# Patient Record
Sex: Female | Born: 1980 | Race: White | Hispanic: No | Marital: Single | Smoking: Never smoker
Health system: Southern US, Community
[De-identification: ages and names within clinical notes are randomized; demographics above are authoritative.]

## PROBLEM LIST (undated history)

## (undated) DIAGNOSIS — I10 Essential (primary) hypertension: Secondary | ICD-10-CM

## (undated) HISTORY — PX: LAPAROSCOPIC OVARIAN: SHX5906

## (undated) HISTORY — PX: VEIN LIGATION AND STRIPPING: SHX2653

## (undated) HISTORY — PX: HERNIA REPAIR: SHX51

## (undated) HISTORY — PX: MENISCUS REPAIR: SHX5179

## (undated) HISTORY — PX: PILONIDAL CYST EXCISION: SHX744

---

## 2001-02-20 ENCOUNTER — Emergency Department (HOSPITAL_COMMUNITY): Admission: EM | Admit: 2001-02-20 | Discharge: 2001-02-21 | Payer: Self-pay | Admitting: Emergency Medicine

## 2001-02-21 ENCOUNTER — Encounter: Payer: Self-pay | Admitting: Emergency Medicine

## 2001-02-26 ENCOUNTER — Emergency Department (HOSPITAL_COMMUNITY): Admission: EM | Admit: 2001-02-26 | Discharge: 2001-02-26 | Payer: Self-pay | Admitting: Emergency Medicine

## 2001-04-18 ENCOUNTER — Emergency Department (HOSPITAL_COMMUNITY): Admission: EM | Admit: 2001-04-18 | Discharge: 2001-04-19 | Payer: Self-pay | Admitting: Emergency Medicine

## 2001-04-18 ENCOUNTER — Encounter: Payer: Self-pay | Admitting: Emergency Medicine

## 2001-09-08 ENCOUNTER — Emergency Department (HOSPITAL_COMMUNITY): Admission: EM | Admit: 2001-09-08 | Discharge: 2001-09-08 | Payer: Self-pay | Admitting: *Deleted

## 2002-01-11 ENCOUNTER — Emergency Department (HOSPITAL_COMMUNITY): Admission: EM | Admit: 2002-01-11 | Discharge: 2002-01-11 | Payer: Self-pay | Admitting: *Deleted

## 2002-02-25 ENCOUNTER — Emergency Department (HOSPITAL_COMMUNITY): Admission: EM | Admit: 2002-02-25 | Discharge: 2002-02-25 | Payer: Self-pay | Admitting: Emergency Medicine

## 2002-08-26 ENCOUNTER — Ambulatory Visit (HOSPITAL_BASED_OUTPATIENT_CLINIC_OR_DEPARTMENT_OTHER): Admission: RE | Admit: 2002-08-26 | Discharge: 2002-08-26 | Payer: Self-pay | Admitting: General Surgery

## 2002-09-27 ENCOUNTER — Encounter: Payer: Self-pay | Admitting: Emergency Medicine

## 2002-09-27 ENCOUNTER — Emergency Department (HOSPITAL_COMMUNITY): Admission: EM | Admit: 2002-09-27 | Discharge: 2002-09-27 | Payer: Self-pay | Admitting: Emergency Medicine

## 2003-08-26 ENCOUNTER — Ambulatory Visit (HOSPITAL_COMMUNITY): Admission: RE | Admit: 2003-08-26 | Discharge: 2003-08-26 | Payer: Self-pay | Admitting: Emergency Medicine

## 2003-08-26 ENCOUNTER — Emergency Department (HOSPITAL_COMMUNITY): Admission: EM | Admit: 2003-08-26 | Discharge: 2003-08-26 | Payer: Self-pay | Admitting: Emergency Medicine

## 2004-06-11 ENCOUNTER — Emergency Department (HOSPITAL_COMMUNITY): Admission: EM | Admit: 2004-06-11 | Discharge: 2004-06-11 | Payer: Self-pay | Admitting: Emergency Medicine

## 2004-06-12 ENCOUNTER — Ambulatory Visit: Admission: RE | Admit: 2004-06-12 | Discharge: 2004-06-12 | Payer: Self-pay | Admitting: Emergency Medicine

## 2004-06-13 ENCOUNTER — Emergency Department (HOSPITAL_COMMUNITY): Admission: EM | Admit: 2004-06-13 | Discharge: 2004-06-13 | Payer: Self-pay | Admitting: Emergency Medicine

## 2006-10-23 ENCOUNTER — Ambulatory Visit: Payer: Self-pay | Admitting: Obstetrics and Gynecology

## 2007-11-17 ENCOUNTER — Emergency Department: Payer: Self-pay | Admitting: Internal Medicine

## 2008-08-17 ENCOUNTER — Ambulatory Visit: Payer: Self-pay | Admitting: Nurse Practitioner

## 2010-05-03 ENCOUNTER — Emergency Department: Payer: Self-pay | Admitting: Internal Medicine

## 2010-08-26 ENCOUNTER — Ambulatory Visit: Payer: Self-pay | Admitting: Family Medicine

## 2010-08-30 ENCOUNTER — Ambulatory Visit: Payer: Self-pay | Admitting: Unknown Physician Specialty

## 2012-06-14 DIAGNOSIS — I8390 Asymptomatic varicose veins of unspecified lower extremity: Secondary | ICD-10-CM | POA: Insufficient documentation

## 2012-06-14 DIAGNOSIS — R011 Cardiac murmur, unspecified: Secondary | ICD-10-CM | POA: Insufficient documentation

## 2012-06-23 ENCOUNTER — Ambulatory Visit: Payer: Self-pay | Admitting: Internal Medicine

## 2012-08-30 DIAGNOSIS — Q872 Congenital malformation syndromes predominantly involving limbs: Secondary | ICD-10-CM | POA: Insufficient documentation

## 2012-08-30 HISTORY — DX: Congenital malformation syndromes predominantly involving limbs: Q87.2

## 2012-11-08 DIAGNOSIS — Q279 Congenital malformation of peripheral vascular system, unspecified: Secondary | ICD-10-CM

## 2012-11-08 HISTORY — DX: Congenital malformation of peripheral vascular system, unspecified: Q27.9

## 2013-03-28 DIAGNOSIS — Q872 Congenital malformation syndromes predominantly involving limbs: Secondary | ICD-10-CM | POA: Insufficient documentation

## 2013-03-28 HISTORY — DX: Congenital malformation syndromes predominantly involving limbs: Q87.2

## 2013-04-27 DIAGNOSIS — I8310 Varicose veins of unspecified lower extremity with inflammation: Secondary | ICD-10-CM | POA: Insufficient documentation

## 2013-04-27 HISTORY — DX: Varicose veins of unspecified lower extremity with inflammation: I83.10

## 2014-11-20 DIAGNOSIS — F419 Anxiety disorder, unspecified: Secondary | ICD-10-CM | POA: Insufficient documentation

## 2015-03-09 ENCOUNTER — Ambulatory Visit: Payer: Self-pay | Admitting: Family Medicine

## 2015-04-17 ENCOUNTER — Other Ambulatory Visit: Payer: Self-pay | Admitting: Specialist

## 2015-04-17 DIAGNOSIS — M25561 Pain in right knee: Secondary | ICD-10-CM

## 2015-04-26 ENCOUNTER — Ambulatory Visit
Admission: RE | Admit: 2015-04-26 | Discharge: 2015-04-26 | Disposition: A | Discharge status: Home / Self Care | Payer: BC Managed Care – PPO | Source: Ambulatory Visit | Attending: Specialist | Admitting: Specialist

## 2015-04-26 DIAGNOSIS — M1711 Unilateral primary osteoarthritis, right knee: Secondary | ICD-10-CM | POA: Diagnosis not present

## 2015-04-26 DIAGNOSIS — M25561 Pain in right knee: Secondary | ICD-10-CM | POA: Diagnosis present

## 2015-04-26 DIAGNOSIS — M7121 Synovial cyst of popliteal space [Baker], right knee: Secondary | ICD-10-CM | POA: Insufficient documentation

## 2015-06-07 DIAGNOSIS — I10 Essential (primary) hypertension: Secondary | ICD-10-CM

## 2015-06-07 DIAGNOSIS — N809 Endometriosis, unspecified: Secondary | ICD-10-CM | POA: Insufficient documentation

## 2015-06-07 DIAGNOSIS — F329 Major depressive disorder, single episode, unspecified: Secondary | ICD-10-CM | POA: Insufficient documentation

## 2015-06-07 DIAGNOSIS — F32A Depression, unspecified: Secondary | ICD-10-CM | POA: Insufficient documentation

## 2015-06-07 DIAGNOSIS — R5383 Other fatigue: Secondary | ICD-10-CM | POA: Insufficient documentation

## 2015-06-07 HISTORY — DX: Essential (primary) hypertension: I10

## 2015-06-08 ENCOUNTER — Ambulatory Visit (INDEPENDENT_AMBULATORY_CARE_PROVIDER_SITE_OTHER): Payer: BC Managed Care – PPO | Admitting: Family Medicine

## 2015-06-08 ENCOUNTER — Encounter: Payer: Self-pay | Admitting: Family Medicine

## 2015-06-08 VITALS — BP 124/62 | HR 72 | Temp 98.3°F | Resp 16 | Ht 65.0 in | Wt 170.0 lb

## 2015-06-08 DIAGNOSIS — Z713 Dietary counseling and surveillance: Secondary | ICD-10-CM

## 2015-06-08 DIAGNOSIS — I10 Essential (primary) hypertension: Secondary | ICD-10-CM | POA: Diagnosis not present

## 2015-06-08 MED ORDER — METOPROLOL SUCCINATE ER 50 MG PO TB24: 50.0000 mg | ORAL_TABLET | Freq: Every day | ORAL | Status: DC

## 2015-06-08 NOTE — Progress Notes (Signed)
Patient ID: Norma Harris, female   DOB: March 27, 1981, 34 y.o.   MRN: [id number]   Patient: Norma Harris Female    DOB: 1981/04/03   34 y.o.   MRN: [id number] Visit Date: 06/08/2015  Today's Provider: Vernie Murders, PA   Chief Complaint  Patient presents with  . Hypertension   Subjective:    HPI   Hypertension, follow-up: Patient use to see Norma Harris and she was told to come in to follow up. BP Readings from Last 3 Encounters:  06/08/15 124/62    She was last seen for hypertension 6 months ago.   Management changes since that visit include none. She reports excellent compliance with treatment. She is not having side effects.  She is exercising. She is not adherent to low salt diet.   Outside blood pressures are 119/60. She is experiencing none.  Patient denies none.   Cardiovascular risk factors include none.  Use of agents associated with hypertension: none.     Weight trend: stable Wt Readings from Last 3 Encounters:  06/08/15 170 lb (77.111 kg)  04/26/15 160 lb (72.576 kg)    Current diet: in general, a "healthy" diet     Patient wanted to discuss new weight loss medications she will be taking and make sure it will not effect her B/P medication. Going on an "all natural" weight loss program ([employer]) - includes shakes and meals with an exercise program.      Previous Medications   CETIRIZINE HCL 10 MG CAPS    Take 1 capsule by mouth daily.   IBUPROFEN (ADVIL,MOTRIN) 400 MG TABLET    Take 400 mg by mouth.    Review of Systems  Constitutional: Negative.   Respiratory: Negative.   Cardiovascular: Negative.   Gastrointestinal: Negative.   Musculoskeletal: Negative.     History  Substance Use Topics  . Smoking status: Never Smoker   . Smokeless tobacco: Never Used  . Alcohol Use: Yes     Comment: very rare   Objective:   BP 124/62 mmHg  Pulse 72  Temp(Src) 98.3 F (36.8 C)  Resp 16  Ht 5\' 5"  (1.651 m)  Wt 170 lb (77.111 kg)  BMI 28.29 kg/m2   LMP   Physical Exam  Constitutional: She is oriented to person, place, and time. She appears well-developed and well-nourished. No distress.  HENT:  Head: Normocephalic and atraumatic.  Right Ear: Hearing normal.  Left Ear: Hearing normal.  Nose: Nose normal.  Eyes: Conjunctivae, EOM and lids are normal. Right eye exhibits no discharge. Left eye exhibits no discharge. No scleral icterus.  Neck: Normal range of motion. Neck supple.  Cardiovascular: Normal rate, regular rhythm and intact distal pulses.   Pulmonary/Chest: Effort normal. No respiratory distress.  Abdominal: Soft. Bowel sounds are normal.  Musculoskeletal: Normal range of motion.  Neurological: She is alert and oriented to person, place, and time.  Skin: Skin is warm, dry and intact. No lesion and no rash noted.  Psychiatric: She has a normal mood and affect. Her speech is normal and behavior is normal. Thought content normal.        Assessment & Plan:     1. Essential hypertension Stable and well controlled. No murmur auscultated today. Cardiologist started the Metoprolol to lower BP and help limit murmur. Will refill medication and recheck 3 months. - metoprolol succinate (TOPROL-XL) 50 MG 24 hr tablet; Take 1 tablet (50 mg total) by mouth daily. Takes 1/2-1 tablet daily  Dispense: 30 tablet; Refill:  6  2. Encounter for weight loss counseling Counseled regarding no use of stimulants and limiting caffeine. Should limit Chromium in supplements to 100-200 mcg qd (a natural glucose tolerance factor that can cause hypoglycemic episodes). Continue regular exercise program. Recheck to evaluate progress in 3 months.   Follow up: Return in about 3 months (around 09/08/2015).

## 2015-07-03 ENCOUNTER — Ambulatory Visit (INDEPENDENT_AMBULATORY_CARE_PROVIDER_SITE_OTHER): Payer: BC Managed Care – PPO | Admitting: Family Medicine

## 2015-07-03 ENCOUNTER — Encounter: Payer: Self-pay | Admitting: Family Medicine

## 2015-07-03 VITALS — BP 124/80 | HR 62 | Temp 98.0°F | Resp 16 | Wt 166.0 lb

## 2015-07-03 DIAGNOSIS — R143 Flatulence: Secondary | ICD-10-CM | POA: Diagnosis not present

## 2015-07-03 DIAGNOSIS — R141 Gas pain: Secondary | ICD-10-CM | POA: Diagnosis not present

## 2015-07-03 DIAGNOSIS — R1013 Epigastric pain: Secondary | ICD-10-CM | POA: Diagnosis not present

## 2015-07-03 DIAGNOSIS — R142 Eructation: Secondary | ICD-10-CM

## 2015-07-03 NOTE — Patient Instructions (Signed)
Celiac Disease Celiac disease is a digestive disease that causes your body's natural defense system (immune system) to react against its own cells. It interferes with taking in (absorbing) nutrients from food. Celiac disease is also known as celiac sprue, nontropical sprue, and gluten-sensitive enteropathy. People who have celiac disease cannot tolerate gluten. Gluten is a protein found in wheat, rye, and barley. With time, celiac disease will damage the cells lining the small intestine. This leads to being unable to absorb nutrients from food (malabsorption), diarrhea, and nutritional problems. CAUSES  Celiac disease is genetic. This means you have a higher likelihood of getting the disease if someone in your family has or has had it. Up to 10% of your close relatives (parent, sibling, child) may also have the disease.  People with celiac disease tend to have other autoimmune diseases. The link may be genetic. These diseases include:  Dermatitis herpetiformis.  Thyroid disease.  Systemic lupus erythematosus.  Type 1 diabetes.  Liver disease.  Collagen vascular disease.  Rheumatoid arthritis.  Sjogren syndrome. SYMPTOMS  The symptoms of celiac disease vary from person to person. The symptoms are generally digestive or nutritional. Digestive symptoms include:  Recurring belly (abdominal) bloating and pain.  Gas.  Long-term (chronic) diarrhea.  Pale, bad-smelling, greasy, or oily stool. Nutritional symptoms include:  Failure to thrive in infants.  Delayed growth in children.  Weight loss in children and adults.  Missed menstrual periods (often due to extreme weight loss).  Anemia.  Weakening bones (osteoporosis).  Fatigue and weakness.  Tingling or other signs of nerve damage (peripheral neuropathy).  Depression. DIAGNOSIS  If your symptoms and physical exam suggest that a digestive disorder or malnutrition is present, your caregiver may suspect celiac disease. You  may have already begun a gluten-free diet. If symptoms persist, testing may be needed to confirm the diagnosis. Some tests are best done while you are on a normal, unrestricted diet. Tests may include:  Blood tests to check for nutritional deficiencies.  Blood tests to look for evidence that the body is producing antibodies against its own small intestine cells.  Taking a tissue sample (biopsy) from the small bowel for evaluation.  X-rays of the small bowel.  Evaluating the stool for fat.  Tests to check for nutrient absorption from the intestine. TREATMENT  It is important to seek treatment. Untreated celiac disease can cause growth problems (in children), anemia, osteoporosis, and possible nerve problems. A pregnant patient with untreated celiac disease has a higher risk of miscarriage, and the fetus has an increased risk of low birth weight and other growth problems. If celiac disease is diagnosed in the early stages, treatment can allow you to live a long, nearly symptom-free life. Treatment includes following a gluten-free diet. This means avoiding all foods that contain gluten. Eating even a small amount of gluten can damage your intestine. For most people, following this diet will stop symptoms. It will heal existing intestinal damage and prevent further damage. Improvements begin within days of starting the diet. The small intestine is usually completely healed within 3 to 6 months, or it may take up to 2 years for older adults. A small percentage of people do not improve on the gluten-free diet. Depending on your age and the stage at which you were diagnosed, some problems such as delayed growth and discolored teeth may not improve. Sometimes, damaged intestines cannot heal. If your intestines are not absorbing enough nutrients, you may need to receive nutrition supplements through an intravenous (IV) tube.  Drug treatments are being tested for unresponsive celiac disease. In this case, you  may need to be evaluated for complications of the disease. Your caregiver may also recommend:  A pneumonia vaccination.  Nutrients and other treatments for any nutritional deficiencies. Your caregiver can provide you with more information on a gluten-free diet. Discussion with a dietitian skilled in this illness will be valuable. Support groups may also be helpful. HOME CARE INSTRUCTIONS   Focus on a gluten-free diet. This diet must become a way of life.  Monitor your response to the gluten-free diet and treat any nutritional deficiencies.  Prepare ahead of time if you decide to eat outside the home.  Make and keep your regular follow-up visits with your caregiver.  Suggest to family members that they get screened for early signs of the disease. SEEK MEDICAL CARE IF:   You continue to have digestive symptoms (gas, cramping, diarrhea) despite a proper diet.  You have trouble sticking to the gluten-free diet.  You develop an itchy rash with groups of tiny blisters.  You develop severe weakness, balance problems, menstrual problems, or depression. Document Released: 12/01/2005 Document Revised: 04/17/2014 Document Reviewed: 03/20/2010 [facility] Patient Information 2015 Norm[place], [place]. This information is not intended to replace advice given to you by your health care provider. Make sure you discuss any questions you have with your health care provider. Lactose Intolerance, Adult Lactose intolerance is when the body is not able to digest lactose, a sugar found in milk and milk products. Lactose intolerance is caused by your body not producing enough of the enzyme lactase. When there is not enough lactase to digest the amount of lactose consumed, discomfort may be felt. Lactose intolerance is not a milk allergy. For most people, lactase deficiency is a condition that develops naturally over time. After about the age of 2, the body begins to produce less lactase. But many people may not  experience symptoms until they are much older. CAUSES Things that can cause you to be lactose intolerant include:  Aging.  Being born without the ability to make lactase.  Certain digestive diseases.  Injuries to the small intestine. SYMPTOMS   Feeling sick to your stomach (nauseous).  Diarrhea.  Cramps.  Bloating.  Gas. Symptoms usually show up a half hour or 2 hours after eating or drinking products containing lactose. TREATMENT  No treatment can improve the body's ability to produce lactase. However, symptoms can be controlled through diet. A medicine may be given to you to take when you consume lactose-containing foods or drinks. The medicine contains the lactase enzyme, which help the body digest lactose better. HOME CARE INSTRUCTIONS  Eat or drink dairy products as told by your caregiver or dietician.  Take all medicine as directed by your caregiver.  Find lactose-free or lactose-reduced products at your local grocery store.  Talk to your caregiver or dietician to decide if you need any dietary supplements. The following is the amount of calcium needed from the diet:  19 to 50 years: 1000 mg  Over 50 years: 1200 mg Calcium and Lactose in Common Foods Non-Dairy Products / Calcium Content (mg)  Calcium-fortified orange juice, 1 cup / 308 to 344 mg  Sardines, with edible bones, 3 oz / 270 mg  Salmon, canned, with edible bones, 3 oz / 205 mg  Soymilk, fortified, 1 cup / 200 mg  Broccoli (raw), 1 cup / 90 mg  Orange, 1 medium / 50 mg  Pinto beans,  cup / 40 mg  Tuna, canned, 3 oz / 10 mg  Lettuce greens,  cup / 10 mg Dairy Products / Calcium Content (mg) / Lactose Content (g)  Yogurt, plain, low-fat, 1 cup / 415 mg / 5 g  Milk, reduced fat, 1 cup / 295 mg / 11 g  Swiss cheese, 1 oz / 270 mg / 1 g  Ice cream,  cup / 85 mg / 6 g  Cottage cheese,  cup / 75 mg / 2 to 3 g SEEK MEDICAL CARE IF: You have no relief from your symptoms. Document  Released: 12/01/2005 Document Revised: 02/23/2012 Document Reviewed: 03/03/2014 [facility] Patient Information 2015 [place], [place]. This information is not intended to replace advice given to you by your health care provider. Make sure you discuss any questions you have with your health care provider.

## 2015-07-03 NOTE — Progress Notes (Signed)
Patient ID: Norma Harris, female   DOB: 11-09-1981, 34 y.o.   MRN: [id number]       Patient: Norma Harris Female    DOB: August 20, 1981   35 y.o.   MRN: [id number] Visit Date: 07/03/2015  Today's Provider: Vernie Murders, PA   Chief Complaint  Patient presents with  . Allergic Reaction    started a diet on June 27th, and  first stomach pain was in July 18th, when eating dairy and breads, " get very bad stomach pain and gas"    Subjective:    HPI  This 34 year old female started an [employer] weight loss program 06-11-15. Noticed intermittent epigastric pain and gas with eating cheese, yogurt, wheat and diary products. States sister has lactose intolerance and wonders if she may have that, an allergy syndrome and/or Celiac disease. Denies hematemesis, hematochezia, melena, constipation or diarrhea.  No past medical history on file. Patient Active Problem List   Diagnosis Date Noted  . Clinical depression 06/07/2015  . Endometriosis 06/07/2015  . Fatigue 06/07/2015  . BP (high blood pressure) 06/07/2015  . Anxiety 11/20/2014  . 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 Cardiac murmur 06/14/2012   Past Surgical History  Procedure Laterality Date  . Hernia repair    . Pilonidal cyst excision    . Laparoscopic ovarian    . Meniscus repair      and ACL repai-has screws/staples-RIGHT  . Vein ligation and stripping      x 7   History  Substance Use Topics  . Smoking status: Never Smoker   . Smokeless tobacco: Never Used  . Alcohol Use: Yes     Comment: very rare   Family History  Problem Relation Age of Onset  . Fibromyalgia Mother   . Hyperlipidemia Father   . Alzheimer's disease Maternal Grandmother   . Diabetes Maternal Grandfather   . Cancer Maternal Grandfather   . Lung cancer Paternal Grandfather      Allergies  Allergen Reactions  . Arnica Rash    Hives,  blisters  . Duloxetine Hcl Other (See Comments)    Suicidal feelings  . Pseudoephedrine Hcl Other (See Comments)   Previous Medications   CETIRIZINE HCL 10 MG CAPS    Take 1 capsule by mouth daily.   IBUPROFEN (ADVIL,MOTRIN) 400 MG TABLET    Take 400 mg by mouth.   METOPROLOL SUCCINATE (TOPROL-XL) 50 MG 24 HR TABLET    Take 1 tablet (50 mg total) by mouth daily. Takes 1/2-1 tablet daily   Review of Systems  Constitutional: Negative.   HENT: Negative.   Respiratory: Negative.   Cardiovascular: Negative.   Gastrointestinal: Positive for abdominal pain. Negative for diarrhea and constipation.       Gas pains with certain foods. No hematemesis, melena or hematochezia.  Genitourinary: Negative.   Musculoskeletal: Negative.   Skin: Negative.   Psychiatric/Behavioral: Negative.     Objective:   BP 124/80 mmHg  Pulse 62  Temp(Src) 98 F (36.7 C) (Oral)  Resp 16  Wt 166 lb (75.297 kg)  Physical Exam  Constitutional: She is oriented to person, place, and time. She appears well-developed and well-nourished. No distress.  HENT:  Head: Normocephalic and atraumatic.  Right Ear: Hearing normal.  Left Ear: Hearing normal.  Nose: Nose normal.  Eyes: Conjunctivae, EOM and lids are normal. Pupils are equal, round, and reactive to light. Right eye exhibits  no discharge. Left eye exhibits no discharge. No scleral icterus.  Cardiovascular: Normal rate and regular rhythm.   Pulmonary/Chest: Effort normal and breath sounds normal. No respiratory distress.  Abdominal: Soft. Bowel sounds are normal. She exhibits no distension and no mass.  Musculoskeletal: Normal range of motion.  Neurological: She is alert and oriented to person, place, and time.  Skin: Skin is intact. No lesion and no rash noted.  Psychiatric: She has a normal mood and affect. Her speech is normal and behavior is normal. Thought content normal.      Assessment & Plan:     1. Epigastric pain Recent onset with starting a  weight loss program. Recommend she use Gas-X and/or antacid or ranitidine. Will get tests to rule out Celiac and Lactose Intolerance disease. Limit fats, dairy products and restrict glutens. Recheck pending lab reports. - CELIAC DISEASE COMPLETE PANEL - Lactose tolerance test  2. Flatulence, eructation and gas pain  Worsening with new weight loss diet. Recommend recheck if tests for [name] or Lactose intolerance negative. May have to get ultrasound to rule out gallbladder disease. - CELIAC DISEASE COMPLETE PANEL - Lactose tolerance test         Vernie Murders, PA  [facility]

## 2015-07-04 ENCOUNTER — Telehealth: Payer: Self-pay | Admitting: Family Medicine

## 2015-07-04 NOTE — Telephone Encounter (Signed)
Pt called saying she had her allergy testing done today but they did not have a test lactose.  She just wanted to know where she can get that test done.  [phone].  Thanks, Con Memos

## 2015-07-05 LAB — CELIAC DISEASE COMPLETE PANEL
Antigliadin Abs, IgA: 7 units (ref 0–19)
GLIADIN IGG: 1 U (ref 0–19)
Tissue Transglut Ab: <2 U/mL (ref 0–5)
Transglutaminase IgA: <2 U/mL (ref 0–3)

## 2015-07-06 NOTE — Telephone Encounter (Signed)
See below

## 2015-07-06 NOTE — Telephone Encounter (Signed)
-----   Message from Margo Common, [place] sent at 07/06/2015 12:49 PM EDT ----- [name]/sprue test negative for glutein problems. The Lactose Intolerance test report has not come in yet.

## 2015-07-06 NOTE — Telephone Encounter (Signed)
[id number] 

## 2015-07-17 NOTE — Telephone Encounter (Signed)
LM for patient  TCB-07/17/15  Note: Spoke with Angie in Customer Service regarding the test order for Lactose Intolerance test. Per Angie test was order in the requisition form but was marked off.  They do perform the test, Patient needs to call to schedule an appointment at the [phone] and will need a new order form print out.  Thanks,  -Joseline

## 2015-07-17 NOTE — Telephone Encounter (Signed)
Pt is returning call.  CB#[phone]/MW

## 2015-07-17 NOTE — Telephone Encounter (Signed)
Patient called back in regards to Lactose test. Advised patient as below. Please call patient when you have new requisition ready, and write down [phone] number so patient can call and schedule her appt. The patient's contact information is correct.

## 2015-07-17 NOTE — Telephone Encounter (Signed)
Patient advised that [name]/Sprue test negative for glutein problems. The Lactose Intolerance test report has not come in yet. Per patient the reason she(Norma Harris) had call was because she(Norma Harris) didn't get the the lactose test, and wanted to know where can she get that test done.  Please Advise.  Thanks,  -Joseline

## 2015-08-28 ENCOUNTER — Encounter: Payer: Self-pay | Admitting: Emergency Medicine

## 2015-08-28 ENCOUNTER — Emergency Department: Payer: BC Managed Care – PPO

## 2015-08-28 ENCOUNTER — Emergency Department
Admission: EM | Admit: 2015-08-28 | Discharge: 2015-08-28 | Disposition: A | Discharge status: Home / Self Care | Payer: BC Managed Care – PPO | Attending: Emergency Medicine | Admitting: Emergency Medicine

## 2015-08-28 DIAGNOSIS — R0602 Shortness of breath: Secondary | ICD-10-CM | POA: Diagnosis present

## 2015-08-28 DIAGNOSIS — I10 Essential (primary) hypertension: Secondary | ICD-10-CM | POA: Insufficient documentation

## 2015-08-28 DIAGNOSIS — Z79899 Other long term (current) drug therapy: Secondary | ICD-10-CM | POA: Insufficient documentation

## 2015-08-28 DIAGNOSIS — F419 Anxiety disorder, unspecified: Secondary | ICD-10-CM | POA: Diagnosis not present

## 2015-08-28 DIAGNOSIS — R091 Pleurisy: Secondary | ICD-10-CM | POA: Insufficient documentation

## 2015-08-28 HISTORY — DX: Essential (primary) hypertension: I10

## 2015-08-28 LAB — BASIC METABOLIC PANEL
Anion gap: 9 (ref 5–15)
BUN: 11 mg/dL (ref 6–20)
CO2: 24 mmol/L (ref 22–32)
Calcium: 9.6 mg/dL (ref 8.9–10.3)
Chloride: 107 mmol/L (ref 101–111)
Creatinine, Ser: 0.83 mg/dL (ref 0.44–1.00)
GFR calc non Af Amer: >60 mL/min (ref >60)
Glucose, Bld: 109 mg/dL — ABNORMAL HIGH (ref 65–99)
POTASSIUM: 3.5 mmol/L (ref 3.5–5.1)
SODIUM: 140 mmol/L (ref 135–145)

## 2015-08-28 LAB — FIBRIN DERIVATIVES D-DIMER (ARMC ONLY): Fibrin derivatives D-dimer (ARMC): 248 (ref 0–499)

## 2015-08-28 LAB — CBC
HEMATOCRIT: 41.1 % (ref 35.0–47.0)
HEMOGLOBIN: 13.8 g/dL (ref 12.0–16.0)
MCH: 29.6 pg (ref 26.0–34.0)
MCHC: 33.5 g/dL (ref 32.0–36.0)
MCV: 88.5 fL (ref 80.0–100.0)
Platelets: 282 10*3/uL (ref 150–440)
RBC: 4.64 MIL/uL (ref 3.80–5.20)
RDW: 13.4 % (ref 11.5–14.5)
WBC: 12.1 10*3/uL — AB (ref 3.6–11.0)

## 2015-08-28 LAB — TROPONIN I: Troponin I: <0.03 ng/mL (ref <0.031)

## 2015-08-28 NOTE — Discharge Instructions (Signed)

## 2015-08-28 NOTE — ED Notes (Signed)
Patient ambulatory to triage with steady gait, without difficulty or distress noted; pt reports since yesterday having pain to left side of neck radiating down into throat and chest with SOB; denies any recent illness; reports some elevated BP

## 2015-08-28 NOTE — ED Provider Notes (Signed)
[facility] Emergency Department Provider Note  ____________________________________________  Time seen: On arrival  I have reviewed the triage vital signs and the nursing notes.   HISTORY  Chief Complaint Shortness of Breath and Chest Pain    HPI Norma Harris is a 34 y.o. female who presents with complaints of pain in the bilateral neck which then progressed to her chest. This started yesterday but has mostly improved today. She was concerned because her blood pressure was elevated when she took it at home. She complains of mild pleurisy but denies overt chest pain. No recent travel. No leg swelling or pain. 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 No fevers no chills. No cough     Past Medical History  Diagnosis Date  . Hypertension     Patient Active Problem List   Diagnosis Date Noted  . Clinical depression 06/07/2015  . Endometriosis 06/07/2015  . Fatigue 06/07/2015  . BP (high blood pressure) 06/07/2015  . Anxiety 11/20/2014  . 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 Cardiac murmur 06/14/2012    Past Surgical History  Procedure Laterality Date  . Hernia repair    . Pilonidal cyst excision    . Laparoscopic ovarian    . Meniscus repair      and ACL repai-has screws/staples-RIGHT  . Vein ligation and stripping      x 7    Current Outpatient Rx  Name  Route  Sig  Dispense  Refill  . ibuprofen (ADVIL,MOTRIN) 400 MG tablet   Oral   Take 400 mg by mouth.         . metoprolol succinate (TOPROL-XL) 50 MG 24 hr tablet   Oral   Take 1 tablet (50 mg total) by mouth daily. Takes 1/2-1 tablet daily   30 tablet   6     Allergies Arnica; Duloxetine hcl; and Pseudoephedrine hcl  Family History  Problem Relation Age of Onset  . Fibromyalgia Mother   . Hyperlipidemia Father   . Alzheimer's  disease Maternal Grandmother   . Diabetes Maternal Grandfather   . Cancer Maternal Grandfather   . Lung cancer Paternal Grandfather     Social History Social History  Substance Use Topics  . Smoking status: Never Smoker   . Smokeless tobacco: Never Used  . Alcohol Use: Yes     Comment: very rare    Review of Systems  Constitutional: Negative for fever. Eyes: Negative for visual changes. ENT: Negative for sore throat Cardiovascular: Negative for chest pain. Respiratory: Negative for shortness of breath. Gastrointestinal: Negative for abdominal pain, vomiting and diarrhea. Genitourinary: Negative for dysuria. Musculoskeletal: Negative for back pain. Skin: Negative for rash. Neurological: Negative for headaches or focal weakness Psychiatric: Mild anxiety    ____________________________________________   PHYSICAL EXAM:  VITAL SIGNS: ED Triage Vitals  Enc Vitals Group     BP 08/28/15 0256 179/86 mmHg     Pulse Rate 08/28/15 0256 109     Resp 08/28/15 0256 18     Temp 08/28/15 0256 98.5 F (36.9 C)     Temp Source 08/28/15 0256 Oral     SpO2 08/28/15 0256 100 %     Weight 08/28/15 0256 160 lb (72.576 kg)     Height 08/28/15 0256 5\' 5"  (1.651 m)     Head Cir --      Peak Flow --  Pain Score 08/28/15 0257 7     Pain Loc --      Pain Edu? --      Excl. in [place]? --      Constitutional: Alert and oriented. Well appearing and in no distress. Eyes: Conjunctivae are normal.  ENT   Head: Normocephalic and atraumatic.   Mouth/Throat: Mucous membranes are moist. Cardiovascular: Normal rate, regular rhythm. Normal and symmetric distal pulses are present in all extremities. No murmurs, rubs, or gallops. Respiratory: Normal respiratory effort without tachypnea nor retractions. Breath sounds are clear and equal bilaterally.  Gastrointestinal: Soft and non-tender in all quadrants. No distention. There is no CVA tenderness. Genitourinary: deferred Musculoskeletal:  Nontender with normal range of motion in all extremities. No lower extremity tenderness nor edema. Neurologic:  Normal speech and language. No gross focal neurologic deficits are appreciated. Skin:  Skin is warm, dry and intact. No rash noted. Psychiatric: Mood and affect are normal. Patient exhibits appropriate insight and judgment.  ____________________________________________    LABS (pertinent positives/negatives)  Labs Reviewed  BASIC METABOLIC PANEL - Abnormal; Notable for the following:    Glucose, Bld 109 (*)    All other components within normal limits  CBC - Abnormal; Notable for the following:    WBC 12.1 (*)    All other components within normal limits  TROPONIN I  FIBRIN DERIVATIVES D-DIMER ([facility] ONLY)    ____________________________________________   EKG  ED ECG REPORT I, Lavonia Drafts, the attending physician, personally viewed and interpreted this ECG.  Date: 08/28/2015 EKG Time: 3:02 AM Rate: 69 Rhythm: normal sinus rhythm QRS Axis: normal Intervals: normal ST/T Wave abnormalities: normal Conduction Disutrbances: none Narrative Interpretation: unremarkable   ____________________________________________    RADIOLOGY I have personally reviewed any xrays that were ordered on this patient: Chest x-ray is unremarkable  ____________________________________________   PROCEDURES  Procedure(s) performed: none  Critical Care performed: none  ____________________________________________   INITIAL IMPRESSION / ASSESSMENT AND PLAN / ED COURSE  Pertinent labs & imaging results that were available during my care of the patient were reviewed by me and considered in my medical decision making (see chart for details).  Patient overall well-appearing. She has mild pleurisy but her lab work and EKG are reassuring. We will send a d-dimer as I gauge her to be low risk.  Patient's d-dimer was negative. She feels well and agrees with discharge and follow-up  with her PCP. She knows to return to the emergency department if any change in her symptoms  ____________________________________________   FINAL CLINICAL IMPRESSION(S) / ED DIAGNOSES  Final diagnoses:  Pleurisy     Lavonia Drafts, MD 08/28/15 [phone]

## 2015-08-30 ENCOUNTER — Encounter: Payer: Self-pay | Admitting: Family Medicine

## 2015-08-30 ENCOUNTER — Ambulatory Visit (INDEPENDENT_AMBULATORY_CARE_PROVIDER_SITE_OTHER): Payer: BC Managed Care – PPO | Admitting: Family Medicine

## 2015-08-30 VITALS — BP 124/78 | HR 75 | Temp 98.3°F | Resp 16 | Wt 166.8 lb

## 2015-08-30 DIAGNOSIS — F341 Dysthymic disorder: Secondary | ICD-10-CM | POA: Diagnosis not present

## 2015-08-30 DIAGNOSIS — F5105 Insomnia due to other mental disorder: Secondary | ICD-10-CM | POA: Diagnosis not present

## 2015-08-30 DIAGNOSIS — R091 Pleurisy: Secondary | ICD-10-CM | POA: Diagnosis not present

## 2015-08-30 DIAGNOSIS — F418 Other specified anxiety disorders: Secondary | ICD-10-CM

## 2015-08-30 MED ORDER — TRAZODONE HCL 50 MG PO TABS: 25.0000 mg | ORAL_TABLET | Freq: Every evening | ORAL | Status: DC | PRN

## 2015-08-30 NOTE — Progress Notes (Signed)
Chief Complaint  Patient presents with  . Hospitalization Follow-up    Subjective:    Patient ID: Norma Harris, female    DOB: 1981-04-26, 34 y.o.   MRN: [id number]  HPI  This 34 year old female developed a sharp pain in her jaw in front of her ears on 08-28-15. She felt the pain progressed to her chest, was worse with taking a deep breath and continued through the night. She went to the ER and was evaluated for pleurisy. EKG, CXR and D-dimer tests were normal. Was found to have BP elevation to 160/90 at home with a reading of 179/86 in the ER despite her usual Metoprolol-XL 50 mg 1/2 tablet qd and presents for follow up.  Past Medical History  Diagnosis Date  . Hypertension    Patient Active Problem List   Diagnosis Date Noted  . Clinical depression 06/07/2015  . Endometriosis 06/07/2015  . Fatigue 06/07/2015  . BP (high blood pressure) 06/07/2015  . Anxiety 11/20/2014  . 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 Cardiac murmur 06/14/2012   Past Surgical History  Procedure Laterality Date  . Hernia repair    . Pilonidal cyst excision    . Laparoscopic ovarian    . Meniscus repair      and ACL repai-has screws/staples-RIGHT  . Vein ligation and stripping      x 7   Family History  Problem Relation Age of Onset  . Fibromyalgia Mother   . Hyperlipidemia Father   . Alzheimer's disease Maternal Grandmother   . Diabetes Maternal Grandfather   . Cancer Maternal Grandfather   . Lung cancer Paternal Grandfather    Social History  Substance Use Topics  . Smoking status: Never Smoker   . Smokeless tobacco: Never Used  . Alcohol Use: Yes     Comment: very rare   Allergies  Allergen Reactions  . Arnica Rash    Hives, blisters  . Duloxetine Hcl Other (See Comments)    Suicidal feelings  . Pseudoephedrine Hcl Other (See Comments)   Current Outpatient  Prescriptions on File Prior to Visit  Medication Sig Dispense Refill  . ibuprofen (ADVIL,MOTRIN) 400 MG tablet Take 400 mg by mouth.    . metoprolol succinate (TOPROL-XL) 50 MG 24 hr tablet Take 1 tablet (50 mg total) by mouth daily. Takes 1/2-1 tablet daily 30 tablet 6   No current facility-administered medications on file prior to visit.   Review of Systems  Constitutional: Negative.   HENT: Negative.   Respiratory: Negative.   Cardiovascular: Negative.   Gastrointestinal: Negative.   Psychiatric/Behavioral: The patient is nervous/anxious.      BP 124/78 mmHg  Pulse 75  Temp(Src) 98.3 F (36.8 C) (Oral)  Resp 16  Wt 166 lb 12.8 oz (75.66 kg)  SpO2 100%  Objective:   Physical Exam  Constitutional: She is oriented to person, place, and time. She appears well-developed and well-nourished. No distress.  HENT:  Head: Normocephalic and atraumatic.  Right Ear: Hearing normal.  Left Ear: Hearing normal.  Nose: Nose normal.  Eyes: Conjunctivae and lids are normal. Right eye exhibits no discharge. Left eye exhibits no discharge. No scleral icterus.  Pulmonary/Chest: Effort normal. No respiratory distress.  Musculoskeletal: Normal range of motion.  Neurological: She is alert and oriented to person, place, and time.  Skin: Skin is intact. No lesion and no rash noted.  Psychiatric: Her speech is normal  and behavior is normal. Thought content normal. Her mood appears anxious.  Difficulty getting to sleep with increase in stress at work [employer]).      Assessment & Plan:   1. Pleurisy Recent onset with normal tests in the ER. Will check CBC for signs of infection. May use moist heat applications and NSAID of choice prn. Recheck pending report.  - CBC with Differential/Platelet  2. Insomnia secondary to depression with anxiety Persistent sleep disturbance with anxiety and depressive symptoms over the past 6 months. She associates it with work stress. Has tried Cymbalta in the past  but had suicidal ideation side effect. Will give trial of Trazodone to help with sleep and help control anxiety with depressive symptoms. Will recheck in a month. - traZODone (DESYREL) 50 MG tablet; Take 0.5-1 tablets (25-50 mg total) by mouth at bedtime as needed for sleep.  Dispense: 30 tablet; Refill: 3

## 2015-09-01 LAB — CBC WITH DIFFERENTIAL/PLATELET
BASOS: 0 %
Basophils Absolute: 0 10*3/uL (ref 0.0–0.2)
EOS (ABSOLUTE): 0.2 10*3/uL (ref 0.0–0.4)
Eos: 2 %
Hematocrit: 40.5 % (ref 34.0–46.6)
Hemoglobin: 13.2 g/dL (ref 11.1–15.9)
IMMATURE GRANS (ABS): 0 10*3/uL (ref 0.0–0.1)
Immature Granulocytes: 0 %
LYMPHS ABS: 3.3 10*3/uL — AB (ref 0.7–3.1)
LYMPHS: 31 %
MCH: 29.8 pg (ref 26.6–33.0)
MCHC: 32.6 g/dL (ref 31.5–35.7)
MCV: 91 fL (ref 79–97)
Monocytes Absolute: 0.6 10*3/uL (ref 0.1–0.9)
Monocytes: 6 %
Neutrophils Absolute: 6.5 10*3/uL (ref 1.4–7.0)
Neutrophils: 61 %
PLATELETS: 308 10*3/uL (ref 150–379)
RBC: 4.43 x10E6/uL (ref 3.77–5.28)
RDW: 13.4 % (ref 12.3–15.4)
WBC: 10.6 10*3/uL (ref 3.4–10.8)

## 2015-09-03 ENCOUNTER — Telehealth: Payer: Self-pay

## 2015-09-03 NOTE — Telephone Encounter (Signed)
Patient advised as directed below. Patient verbalized understanding.  

## 2015-09-03 NOTE — Telephone Encounter (Signed)
-----   Message from Margo Common, [place] sent at 09/03/2015  3:11 PM EDT ----- WBC count back to normal from measurement in the ER on 08-28-15. Proceed with present coarse and recheck if chest discomfort returns or persists.

## 2015-09-10 ENCOUNTER — Ambulatory Visit: Payer: BC Managed Care – PPO | Admitting: Family Medicine

## 2015-09-28 ENCOUNTER — Ambulatory Visit: Payer: BC Managed Care – PPO | Admitting: Family Medicine

## 2015-11-28 ENCOUNTER — Ambulatory Visit (INDEPENDENT_AMBULATORY_CARE_PROVIDER_SITE_OTHER): Payer: BC Managed Care – PPO | Admitting: Physician Assistant

## 2015-11-28 ENCOUNTER — Encounter: Payer: Self-pay | Admitting: Physician Assistant

## 2015-11-28 VITALS — BP 140/90 | HR 83 | Temp 97.9°F | Resp 16 | Wt 169.0 lb

## 2015-11-28 DIAGNOSIS — R05 Cough: Secondary | ICD-10-CM

## 2015-11-28 DIAGNOSIS — J0140 Acute pansinusitis, unspecified: Secondary | ICD-10-CM | POA: Diagnosis not present

## 2015-11-28 DIAGNOSIS — R059 Cough, unspecified: Secondary | ICD-10-CM

## 2015-11-28 MED ORDER — BENZONATATE 100 MG PO CAPS: 100.0000 mg | ORAL_CAPSULE | Freq: Two times a day (BID) | ORAL | Status: DC | PRN

## 2015-11-28 MED ORDER — AMOXICILLIN 875 MG PO TABS: 875.0000 mg | ORAL_TABLET | Freq: Two times a day (BID) | ORAL | Status: DC

## 2015-11-28 NOTE — Progress Notes (Signed)
Patient: Norma Harris Female    DOB: 06/29/1981   34 y.o.   MRN: [id number] Visit Date: 11/28/2015  Today's Provider: Mar Daring, PA-C   Chief Complaint  Patient presents with  . Sinusitis   Subjective:    Sinusitis This is a new problem. The current episode started in the past 7 days. The problem has been gradually worsening since onset. There has been no fever. Associated symptoms include chills, congestion, coughing (this afternoon), ear pain, headaches, sinus pressure, sneezing and a sore throat (right side eas hurting yesterday and today is a little better). Pertinent negatives include no shortness of breath. Treatments tried: cetirizine and Ibuprofen.  She has also been taking Zicam and drinking hot tea with lemon and honey. This has been helping some. She states she did have a sore throat yesterday but that has improved some today. She does have a lot of congestion in her sinuses as well as postnasal drainage. The postnasal drainage is causing a slight dry cough. 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     Allergies  Allergen Reactions  . Arnica Rash    Hives, blisters  . Duloxetine Hcl Other (See Comments)    Suicidal feelings  . Pseudoephedrine Hcl Other (See Comments)   Previous Medications   CETIRIZINE (ZYRTEC) 10 MG TABLET    Take 10 mg by mouth daily.   IBUPROFEN (ADVIL,MOTRIN) 400 MG TABLET    Take 400 mg by mouth.   METOPROLOL SUCCINATE (TOPROL-XL) 50 MG 24 HR TABLET    Take 1 tablet (50 mg total) by mouth daily. Takes 1/2-1 tablet daily    Review of Systems  Constitutional: Positive for chills. Negative for fever and fatigue.  HENT: Positive for congestion, ear pain, postnasal drip, rhinorrhea (yellowish and greenish thick phlemgh), sinus pressure, sneezing, sore throat (right side eas hurting yesterday and today is a little better) and voice change (hoarse). Negative  for tinnitus and trouble swallowing.   Eyes: Negative.   Respiratory: Positive for cough (this afternoon). Negative for chest tightness, shortness of breath and wheezing.   Cardiovascular: Negative.   Gastrointestinal: Negative for nausea, vomiting and abdominal pain.  Neurological: Positive for headaches. Negative for dizziness.    Social History  Substance Use Topics  . Smoking status: Never Smoker   . Smokeless tobacco: Never Used  . Alcohol Use: Yes     Comment: very rare   Objective:   BP 140/90 mmHg  Pulse 83  Temp(Src) 97.9 F (36.6 C) (Oral)  Resp 16  Wt 169 lb (76.658 kg)  SpO2 98%  Physical Exam  Constitutional: She appears well-developed and well-nourished. No distress.  HENT:  Head: Normocephalic and atraumatic.  Right Ear: Hearing, tympanic membrane, external ear and ear canal normal.  Left Ear: Hearing, tympanic membrane, external ear and ear canal normal.  Nose: Mucosal edema and rhinorrhea present. Right sinus exhibits maxillary sinus tenderness and frontal sinus tenderness. Left sinus exhibits maxillary sinus tenderness and frontal sinus tenderness.  Mouth/Throat: Uvula is midline and mucous membranes are normal. Posterior oropharyngeal erythema present. No oropharyngeal exudate or posterior oropharyngeal edema.  Neck: Normal range of motion. Neck supple. No tracheal deviation present. No thyromegaly present.  Cardiovascular: Normal rate, regular rhythm and normal heart sounds.  Exam reveals no gallop and no friction rub.   No murmur heard. Pulmonary/Chest: Effort normal and breath  sounds normal. No stridor. No respiratory distress. She has no wheezes. She has no rales.  Lymphadenopathy:    She has no cervical adenopathy.  Skin: She is not diaphoretic.  Vitals reviewed.       Assessment & Plan:     1. Acute pansinusitis, recurrence not specified  I will treat with amoxicillin as below due to worsening symptoms over one week without relief to  over-the-counter treatment. I advised her to try Coricidin HBP for congestion. I also advised her to make sure to continue to stay well-hydrated and try to get as much rest as possible. She may take Tylenol as needed for fevers. She is to call the office if symptoms they'll to improve or worsen over the next 7-10 days. - amoxicillin (AMOXIL) 875 MG tablet; Take 1 tablet (875 mg total) by mouth 2 (two) times daily.  Dispense: 20 tablet; Refill: 0  2. Cough She is concerned about coughing during the middle of her showed that she has coming up this weekend. She has speaking and singing rolls and is concerned about a cough. I will give her Tessalon Perles as below for cough suppression but that way it will not cause drowsiness. She is to call the office if her cough does not improve or worsens. - benzonatate (TESSALON) 100 MG capsule; Take 1 capsule (100 mg total) by mouth 2 (two) times daily as needed for cough.  Dispense: 20 capsule; Refill: 0       Mar Daring, PA-C  [facility]

## 2015-11-28 NOTE — Patient Instructions (Signed)

## 2015-12-06 ENCOUNTER — Ambulatory Visit: Payer: BC Managed Care – PPO | Admitting: Family Medicine

## 2015-12-06 ENCOUNTER — Ambulatory Visit (INDEPENDENT_AMBULATORY_CARE_PROVIDER_SITE_OTHER): Payer: BC Managed Care – PPO | Admitting: Physician Assistant

## 2015-12-06 ENCOUNTER — Encounter: Payer: Self-pay | Admitting: Physician Assistant

## 2015-12-06 VITALS — BP 122/70 | HR 80 | Temp 98.5°F | Resp 16 | Wt 166.0 lb

## 2015-12-06 DIAGNOSIS — R05 Cough: Secondary | ICD-10-CM

## 2015-12-06 DIAGNOSIS — J40 Bronchitis, not specified as acute or chronic: Secondary | ICD-10-CM

## 2015-12-06 DIAGNOSIS — J029 Acute pharyngitis, unspecified: Secondary | ICD-10-CM | POA: Diagnosis not present

## 2015-12-06 DIAGNOSIS — J0140 Acute pansinusitis, unspecified: Secondary | ICD-10-CM

## 2015-12-06 DIAGNOSIS — R059 Cough, unspecified: Secondary | ICD-10-CM

## 2015-12-06 MED ORDER — MOXIFLOXACIN HCL 400 MG PO TABS: 400.0000 mg | ORAL_TABLET | Freq: Every day | ORAL | Status: DC

## 2015-12-06 MED ORDER — MAGIC MOUTHWASH W/LIDOCAINE: 5.0000 mL | Freq: Three times a day (TID) | ORAL | Status: DC | PRN

## 2015-12-06 MED ORDER — PREDNISONE 10 MG PO TABS: ORAL_TABLET | ORAL | Status: DC

## 2015-12-06 MED ORDER — HYDROCODONE-HOMATROPINE 5-1.5 MG/5ML PO SYRP: 5.0000 mL | ORAL_SOLUTION | Freq: Four times a day (QID) | ORAL | Status: DC | PRN

## 2015-12-06 NOTE — Patient Instructions (Signed)

## 2015-12-06 NOTE — Progress Notes (Signed)
Patient: Norma Harris Female    DOB: December 30, 1980   34 y.o.   MRN: [id number] Visit Date: 12/06/2015  Today's Provider: Mar Daring, PA-C   Chief Complaint  Patient presents with  . Cough   Subjective:    Cough This is a recurrent problem. The current episode started 1 to 4 weeks ago. The problem has been gradually worsening. The problem occurs constantly. The cough is productive of sputum. Associated symptoms include ear congestion, nasal congestion, postnasal drip, rhinorrhea and a sore throat. Pertinent negatives include no chest pain, chills, fever, headaches, shortness of breath or wheezing. Treatments tried: Patient is currently taking amoxicillin and Benzonatate. The treatment provided no relief.       Allergies  Allergen Reactions  . Arnica Rash    Hives, blisters  . Duloxetine Hcl Other (See Comments)    Suicidal feelings  . Pseudoephedrine Hcl Other (See Comments)   Previous Medications   AMOXICILLIN (AMOXIL) 875 MG TABLET    Take 1 tablet (875 mg total) by mouth 2 (two) times daily.   BENZONATATE (TESSALON) 100 MG CAPSULE    Take 1 capsule (100 mg total) by mouth 2 (two) times daily as needed for cough.   CETIRIZINE (ZYRTEC) 10 MG TABLET    Take 10 mg by mouth daily.   IBUPROFEN (ADVIL,MOTRIN) 400 MG TABLET    Take 400 mg by mouth.   METOPROLOL SUCCINATE (TOPROL-XL) 50 MG 24 HR TABLET    Take 1 tablet (50 mg total) by mouth daily. Takes 1/2-1 tablet daily    Review of Systems  Constitutional: Negative for fever and chills.  HENT: Positive for congestion (chest congestion), postnasal drip, rhinorrhea, sore throat, trouble swallowing and voice change.   Eyes: Negative.   Respiratory: Positive for cough and chest tightness. Negative for shortness of breath and wheezing.   Cardiovascular: Negative for chest pain.  Gastrointestinal: Negative for nausea, vomiting and abdominal pain.  Neurological: Negative for dizziness and headaches.    Social History    Substance Use Topics  . Smoking status: Never Smoker   . Smokeless tobacco: Never Used  . Alcohol Use: Yes     Comment: very rare   Objective:   BP 122/70 mmHg  Pulse 80  Temp(Src) 98.5 F (36.9 C) (Oral)  Resp 16  Wt 166 lb (75.297 kg)  SpO2 98%  Physical Exam  Constitutional: She appears well-developed and well-nourished. No distress.  HENT:  Head: Normocephalic and atraumatic.  Right Ear: Hearing, external ear and ear canal normal. Tympanic membrane is not erythematous and not bulging. A middle ear effusion is present.  Left Ear: Hearing, external ear and ear canal normal. Tympanic membrane is not erythematous and not bulging. A middle ear effusion is present.  Nose: Mucosal edema and rhinorrhea present. Right sinus exhibits maxillary sinus tenderness and frontal sinus tenderness. Left sinus exhibits maxillary sinus tenderness and frontal sinus tenderness.  Mouth/Throat: Uvula is midline and mucous membranes are normal. Posterior oropharyngeal erythema present. No oropharyngeal exudate or posterior oropharyngeal edema.  Neck: Normal range of motion. Neck supple. No tracheal deviation present. No thyromegaly present.  Cardiovascular: Normal rate, regular rhythm and normal heart sounds.  Exam reveals no gallop and no friction rub.   No murmur heard. Pulmonary/Chest: Effort normal and breath sounds normal. No stridor. No respiratory distress. She has no wheezes. She has no rales.  Lymphadenopathy:    She has no cervical adenopathy.  Skin: She is not diaphoretic.  Vitals reviewed.       Assessment & Plan:     1. Pharyngitis Her chief complaint now is sore throat. She states that she does not want to eat or drink because of the sore throat. I do feel the sore throat is most likely secondary to irritation from postnasal drainage and cough. I will prescribe dukes Magic mouthwash as below for her to use for a sore throat. We also discussed use of gargling salt water. She is to call  the office if symptoms fail to improve or worsen. - magic mouthwash w/lidocaine SOLN; Take 5 mLs by mouth 3 (three) times daily as needed for mouth pain.  Dispense: 120 mL; Refill: 0  2. Cough Cough has been worsening even with treatment with amoxicillin and Tessalon Perles. Now that she is out of school for Christmas break and does not have any other shows I will give her the Hycodan cough syrup for her nighttime cough. She may also take this during the day as needed especially if she no she does not have to go anywhere due to drowsiness precautions. I will also prescribe her 12 day prednisone taper for the cough. She needs to continue to stay well-hydrated and try to get as much rest as possible. She is to call the office if she does not improve or if symptoms worsen. - HYDROcodone-homatropine (HYCODAN) 5-1.5 MG/5ML syrup; Take 5 mLs by mouth every 6 (six) hours as needed for cough.  Dispense: 240 mL; Refill: 0 - predniSONE (DELTASONE) 10 MG tablet; Take 6 tabs PO on day 1&2, 5 tabs PO on day 3&4, 4 tabs PO on day 5&6, 3 tabs PO on day 7&8, 2 tabs PO on day 9&10, 1 tab PO on day 11&12.  Dispense: 42 tablet; Refill: 0  3. Bronchitis Worsening symptoms. I will treat with Avelox since she did not respond to amoxicillin. She is to call the office if she does not respond to treatment in 10 the 14 days. - moxifloxacin (AVELOX) 400 MG tablet; Take 1 tablet (400 mg total) by mouth daily.  Dispense: 10 tablet; Refill: 0 - predniSONE (DELTASONE) 10 MG tablet; Take 6 tabs PO on day 1&2, 5 tabs PO on day 3&4, 4 tabs PO on day 5&6, 3 tabs PO on day 7&8, 2 tabs PO on day 9&10, 1 tab PO on day 11&12.  Dispense: 42 tablet; Refill: 0  4. Acute pansinusitis, recurrence not specified Worsening symptoms. I will treat with Avelox since she did not respond to amoxicillin. She is to call the office if she does not respond to treatment in 10 the 14 days. - moxifloxacin (AVELOX) 400 MG tablet; Take 1 tablet (400 mg total)  by mouth daily.  Dispense: 10 tablet; Refill: 0       Mar Daring, PA-C  [facility]

## 2016-01-04 ENCOUNTER — Encounter: Payer: Self-pay | Admitting: *Deleted

## 2016-01-05 NOTE — Progress Notes (Signed)
Patient ID: Norma Harris, female   DOB: January 19, 1981, 35 y.o.   MRN: [id number]     Cardiology Office Note   Date:  01/07/2016   ID:  Norma Harris, DOB Nov 28, 1981, MRN [id number]  PCP:  Norma Murders, PA  Cardiologist:   Norma Rouge, MD   Chief Complaint  Patient presents with  . Heart Murmur      History of Present Illness: Norma Harris is a 35 y.o. female who presents for evaluation of murmur.  12/06/15 seen by primary with pharyngitis and bronchitis.  For over a year has had issues sleeping.  Feels like her heart flip flops then gets tachycardic then she feels like she cannot breath.  Cannot go back to sleep after that.  Was told she had a murmur years ago.  Previously seen by "counselor" over 8 years ago for anxiety. Denies excess caffeine , drugs, ETOH.  Has one younger sister with no heart issues.    Long standing issues with venous system RLE with multiple vein strippings    BP better on Toprol which she takes at night      Past Medical History  Diagnosis Date  . Hypertension     Past Surgical History  Procedure Laterality Date  . Hernia repair    . Pilonidal cyst excision    . Laparoscopic ovarian    . Meniscus repair      and ACL repai-has screws/staples-RIGHT  . Vein ligation and stripping      x 7     Current Outpatient Prescriptions  Medication Sig Dispense Refill  . cetirizine (ZYRTEC) 10 MG tablet Take 10 mg by mouth daily.    Marland Kitchen ibuprofen (ADVIL,MOTRIN) 400 MG tablet Take 400 mg by mouth.    . metoprolol succinate (TOPROL-XL) 50 MG 24 hr tablet Take 1 tablet (50 mg total) by mouth daily. Takes 1/2-1 tablet daily 30 tablet 6   No current facility-administered medications for this visit.    Allergies:   Arnica; Duloxetine hcl; and Pseudoephedrine hcl    Social History:  The patient  reports that she has never smoked. She has never used smokeless tobacco. She reports that she drinks alcohol. She reports that she does not use illicit drugs.   Family  History:  The patient's family history includes Alzheimer's disease in her maternal grandmother; Cancer in her maternal grandfather; Diabetes in her maternal grandfather; Fibromyalgia in her mother; Hyperlipidemia in her father; Lung cancer in her paternal grandfather.    ROS:  Please see the history of present illness.   Otherwise, review of systems are positive for none.   All other systems are reviewed and negative.    PHYSICAL EXAM: VS:  BP 146/70 mmHg  Pulse 74  Ht 5\' 5"  (1.651 m)  Wt 76.567 kg (168 lb 12.8 oz)  BMI 28.09 kg/m2  SpO2 98% , BMI Body mass index is 28.09 kg/(m^2). Affect appropriate Healthy:  appears stated age 88: normal Neck supple with no adenopathy JVP normal no bruits no thyromegaly Lungs clear with no wheezing and good diaphragmatic motion Heart:  S1/S2 1/6 SEM  murmur, no rub, gallop or click PMI normal Abdomen: benighn, BS positve, no tenderness, no AAA no bruit.  No HSM or HJR Distal pulses intact with no bruits RLE mild edema Port Wine Stain previous stripping  Neuro non-focal Skin warm and dry No muscular weakness    EKG:  08/29/15  SR rate 69  Normal ECG    Recent Labs: 08/28/2015:  BUN 11; Creatinine, Ser 0.83; Hemoglobin 13.8; Potassium 3.5; Sodium 140 08/31/2015: Platelets 308    Lipid Panel No results found for: CHOL, TRIG, HDL, CHOLHDL, VLDL, LDLCALC, LDLDIRECT    Wt Readings from Last 3 Encounters:  01/07/16 76.567 kg (168 lb 12.8 oz)  12/06/15 75.297 kg (166 lb)  11/28/15 76.658 kg (169 lb)      Other studies Reviewed: Additional studies/ records that were reviewed today include: Epic notes .    ASSESSMENT AND PLAN:  1.  Palpitations:  Given frequency at night will order event monitor  Continue beta blocker 2. Dyspnea:  At night f/u echo r/o PND/orhtopnea.  Sleep study r/o OSA  ? Related to anxiety and sleep disorder 3. HTN:  Well controlled.  Continue current medications and low sodium Dash type diet.   4. Anxiety  If  above studies normal encouraged her to seek counseling again for anxiety 5. Venous:  RLE venous reflux Support hose f/u [facility]  ? Congenital issue    Current medicines are reviewed at length with the patient today.  The patient does not have concerns regarding medicines.  The following changes have been made:  no change  Labs/ tests ordered today include:  Echo, Sleep Study Event monitor    Orders Placed This Encounter  Procedures  . Cardiac event monitor  . Echocardiogram  . Split night study     Disposition:   FU with me PRN     Signed, Norma Rouge, MD  01/07/2016 4:37 PM    [facility] [place], [place], [place]  [address] Phone: [phone]; Fax: [phone]

## 2016-01-07 ENCOUNTER — Ambulatory Visit (INDEPENDENT_AMBULATORY_CARE_PROVIDER_SITE_OTHER): Payer: BC Managed Care – PPO | Admitting: Cardiovascular Disease

## 2016-01-07 ENCOUNTER — Encounter: Payer: Self-pay | Admitting: Cardiovascular Disease

## 2016-01-07 VITALS — BP 146/70 | HR 74 | Ht 65.0 in | Wt 168.8 lb

## 2016-01-07 DIAGNOSIS — R0600 Dyspnea, unspecified: Secondary | ICD-10-CM

## 2016-01-07 DIAGNOSIS — R011 Cardiac murmur, unspecified: Secondary | ICD-10-CM

## 2016-01-07 NOTE — Patient Instructions (Addendum)
Medication Instructions:  Your physician recommends that you continue on your current medications as directed. Please refer to the Current Medication list given to you today.  Labwork: NONE  Testing/Procedures: Your physician has requested that you have an echocardiogram. Echocardiography is a painless test that uses sound waves to create images of your heart. It provides your doctor with information about the size and shape of your heart and how well your heart's chambers and valves are working. This procedure takes approximately one hour. There are no restrictions for this procedure.  Your physician has recommended that you wear an 21 day event monitor. Event monitors are medical devices that record the heart's electrical activity. Doctors most often [place] these monitors to diagnose arrhythmias. Arrhythmias are problems with the speed or rhythm of the heartbeat. The monitor is a small, portable device. You can wear one while you do your normal daily activities. This is usually used to diagnose what is causing palpitations/syncope (passing out).  Your physician has recommended that you have a sleep study. This test records several body functions during sleep, including: brain activity, eye movement, oxygen and carbon dioxide blood levels, heart rate and rhythm, breathing rate and rhythm, the flow of air through your mouth and nose, snoring, body muscle movements, and chest and belly movement.  Follow-Up: Your physician wants you to follow-up as needed.   If you need a refill on your cardiac medications before your next appointment, please call your pharmacy.

## 2016-01-09 ENCOUNTER — Other Ambulatory Visit: Payer: Self-pay | Admitting: Cardiovascular Disease

## 2016-01-09 DIAGNOSIS — R011 Cardiac murmur, unspecified: Secondary | ICD-10-CM

## 2016-01-09 DIAGNOSIS — R000 Tachycardia, unspecified: Secondary | ICD-10-CM

## 2016-01-09 DIAGNOSIS — R002 Palpitations: Secondary | ICD-10-CM

## 2016-01-09 DIAGNOSIS — R0600 Dyspnea, unspecified: Secondary | ICD-10-CM

## 2016-01-10 ENCOUNTER — Ambulatory Visit (INDEPENDENT_AMBULATORY_CARE_PROVIDER_SITE_OTHER): Payer: BC Managed Care – PPO

## 2016-01-10 DIAGNOSIS — R002 Palpitations: Secondary | ICD-10-CM | POA: Diagnosis not present

## 2016-01-10 DIAGNOSIS — R011 Cardiac murmur, unspecified: Secondary | ICD-10-CM | POA: Diagnosis not present

## 2016-01-10 DIAGNOSIS — R0600 Dyspnea, unspecified: Secondary | ICD-10-CM | POA: Diagnosis not present

## 2016-01-10 DIAGNOSIS — R000 Tachycardia, unspecified: Secondary | ICD-10-CM | POA: Diagnosis not present

## 2016-01-21 ENCOUNTER — Other Ambulatory Visit: Payer: Self-pay

## 2016-01-21 ENCOUNTER — Ambulatory Visit (HOSPITAL_COMMUNITY): Payer: BC Managed Care – PPO | Attending: Cardiovascular Disease

## 2016-01-21 DIAGNOSIS — R0600 Dyspnea, unspecified: Secondary | ICD-10-CM

## 2016-01-21 DIAGNOSIS — R011 Cardiac murmur, unspecified: Secondary | ICD-10-CM | POA: Insufficient documentation

## 2016-01-21 DIAGNOSIS — I10 Essential (primary) hypertension: Secondary | ICD-10-CM | POA: Insufficient documentation

## 2016-01-31 ENCOUNTER — Telehealth: Payer: Self-pay | Admitting: Cardiovascular Disease

## 2016-01-31 NOTE — Telephone Encounter (Signed)
Patient aware, verbalizes understanding and appreciation for information.

## 2016-01-31 NOTE — Telephone Encounter (Signed)
New Message ° °Pt called for ECHO Results °

## 2016-02-19 ENCOUNTER — Telehealth: Payer: Self-pay | Admitting: Cardiovascular Disease

## 2016-02-19 NOTE — Telephone Encounter (Signed)
Patient called back for monitor results. Per Dr. Johnsie Cancel, monitor shows no arrhythmia even with symptoms. Patient verbalized understanding.

## 2016-02-19 NOTE — Telephone Encounter (Signed)
Returning your call. °

## 2016-02-26 ENCOUNTER — Ambulatory Visit (HOSPITAL_BASED_OUTPATIENT_CLINIC_OR_DEPARTMENT_OTHER): Payer: BC Managed Care – PPO | Attending: Cardiovascular Disease

## 2016-02-26 VITALS — Ht 65.0 in | Wt 165.0 lb

## 2016-02-26 DIAGNOSIS — I10 Essential (primary) hypertension: Secondary | ICD-10-CM | POA: Insufficient documentation

## 2016-02-26 DIAGNOSIS — R0683 Snoring: Secondary | ICD-10-CM | POA: Diagnosis not present

## 2016-02-26 DIAGNOSIS — R0600 Dyspnea, unspecified: Secondary | ICD-10-CM

## 2016-02-26 DIAGNOSIS — Z79899 Other long term (current) drug therapy: Secondary | ICD-10-CM | POA: Insufficient documentation

## 2016-02-28 ENCOUNTER — Telehealth: Payer: Self-pay | Admitting: Cardiology

## 2016-02-28 NOTE — Telephone Encounter (Signed)
Patient informed of information.  Stated verbal understanding  

## 2016-02-28 NOTE — Sleep Study (Signed)
   Patient Name: Norma Harris, Norma Harris MRN: [id number] Study Date: 02/26/2016 Gender: Female D.O.B: 1981-09-15 Age (years): 34 Referring Provider: Jenkins Rouge Interpreting Physician: Fransico Him MD, ABSM RPSGT: Joni Reining  Weight (lbs): 165 BMI: 27 Height (inches): 65 Neck Size: 14.00  CLINICAL INFORMATION Sleep Study Type: NPSG Indication for sleep study: Hypertension Epworth Sleepiness Score: 3  SLEEP STUDY TECHNIQUE As per the AASM Manual for the Scoring of Sleep and Associated Events v2.3 (April 2016) with a hypopnea requiring 4% desaturations. The channels recorded and monitored were frontal, central and occipital EEG, electrooculogram (EOG), submentalis EMG (chin), nasal and oral airflow, thoracic and abdominal wall motion, anterior tibialis EMG, snore microphone, electrocardiogram, and pulse oximetry.  MEDICATIONS Patient's medications include: Zyrtec, Advil, Metoprolol. Medications self-administered by patient during sleep study : No sleep medicine administered.  SLEEP ARCHITECTURE The study was initiated at 10:50:57 PM and ended at 4:58:16 AM. Sleep onset time was 106.3 minutes and the sleep efficiency was reduced at 49.5%. The total sleep time was 182.0 minutes. Stage REM latency was markedly prolonged at  229.0 minutes. The patient spent 20.05% of the night in stage N1 sleep, 55.77% in stage N2 sleep, 6.59% in stage N3 and 17.58% in REM. Alpha intrusion was absent. Supine sleep was 4.67%.  RESPIRATORY PARAMETERS The overall apnea/hypopnea index (AHI) was 0.0 per hour. There were 0 total apneas, including 0 obstructive, 0 central and 0 mixed apneas. There were 0 hypopneas and 0 RERAs. The AHI during Stage REM sleep was 0.0 per hour. AHI while supine was 0.0 per hour. The mean oxygen saturation was 97.15%. The minimum SpO2 during sleep was 96.00%. Soft snoring was noted during this study.  CARDIAC DATA The 2 lead EKG demonstrated sinus rhythm. The mean heart rate  was 61.85 beats per minute. Other EKG findings include: None.  LEG MOVEMENT DATA The total PLMS were 0 with a resulting PLMS index of 0.00. Associated arousal with leg movement index was 0.0 .  IMPRESSIONS - No significant obstructive sleep apnea occurred during this study (AHI = 0.0/h). - No significant central sleep apnea occurred during this study (CAI = 0.0/h). - The patient had minimal or no oxygen desaturation during the study (Min O2 = 96.00%) - The patient snored with Soft snoring volume. - No cardiac abnormalities were noted during this study. - Clinically significant periodic limb movements did not occur during sleep. No significant associated arousals.  DIAGNOSIS - Snoring  RECOMMENDATIONS - Avoid alcohol, sedatives and other CNS depressants that may result in sleep apnea and disrupt normal sleep architecture. - Sleep hygiene should be reviewed to assess factors that may improve sleep quality. - Weight management and regular exercise should be initiated or continued if appropriate.   Sueanne Margarita Diplomate, American Board of Sleep Medicine  ELECTRONICALLY SIGNED ON:  02/28/2016, 12:40 PM [place] PH: [phone]   FX: [phone] [place]

## 2016-02-28 NOTE — Telephone Encounter (Signed)
Please let patient know that sleep study showed no significant sleep apnea.    

## 2016-02-28 NOTE — Telephone Encounter (Signed)
Left message to call back regarding results.

## 2016-03-03 ENCOUNTER — Ambulatory Visit (HOSPITAL_BASED_OUTPATIENT_CLINIC_OR_DEPARTMENT_OTHER): Payer: BC Managed Care – PPO

## 2016-03-18 ENCOUNTER — Encounter (HOSPITAL_BASED_OUTPATIENT_CLINIC_OR_DEPARTMENT_OTHER): Payer: BC Managed Care – PPO

## 2016-03-20 ENCOUNTER — Ambulatory Visit (INDEPENDENT_AMBULATORY_CARE_PROVIDER_SITE_OTHER): Payer: BC Managed Care – PPO | Admitting: Family Medicine

## 2016-03-20 ENCOUNTER — Encounter: Payer: Self-pay | Admitting: Family Medicine

## 2016-03-20 ENCOUNTER — Ambulatory Visit (INDEPENDENT_AMBULATORY_CARE_PROVIDER_SITE_OTHER): Payer: BC Managed Care – PPO

## 2016-03-20 VITALS — BP 108/82 | HR 60 | Temp 98.1°F | Resp 14 | Wt 173.0 lb

## 2016-03-20 DIAGNOSIS — Z23 Encounter for immunization: Secondary | ICD-10-CM

## 2016-03-20 DIAGNOSIS — R1011 Right upper quadrant pain: Secondary | ICD-10-CM

## 2016-03-20 NOTE — Progress Notes (Signed)
Patient ID: [name], female   DOB: 10/26/1981, 35 y.o.   MRN: [id number]   Patient: Norma Harris Female    DOB: [birth year]   35 y.o.   MRN: [id number] Visit Date: 03/20/2016  Today's Provider: Vernie Murders, PA   Chief Complaint  Patient presents with  . Abdominal Pain   Subjective:    Abdominal Pain This is a new problem. The current episode started 1 to 4 weeks ago. The problem occurs intermittently. The problem has been unchanged. The pain is located in the RUQ. The quality of the pain is dull and sharp. The abdominal pain radiates to the back. Associated symptoms include belching and nausea. The pain is aggravated by eating. The pain is relieved by passing flatus. She has tried nothing for the symptoms. Family history positive for mother and sister (younger) having gallbladder surgeries.   Past Medical History  Diagnosis Date  . Hypertension    Past Surgical History  Procedure Laterality Date  . Hernia repair    . Pilonidal cyst excision    . Laparoscopic ovarian    . Meniscus repair      and ACL repai-has screws/staples-RIGHT  . Vein ligation and stripping      x 7   Family History  Problem Relation Age of Onset  . Fibromyalgia Mother   . Hyperlipidemia Father   . Alzheimer's disease Maternal Grandmother   . Diabetes Maternal Grandfather   . Cancer Maternal Grandfather   . Lung cancer Paternal Grandfather      Previous Medications   CETIRIZINE (ZYRTEC) 10 MG TABLET    Take 10 mg by mouth daily.   IBUPROFEN (ADVIL,MOTRIN) 400 MG TABLET    Take 400 mg by mouth.   METOPROLOL SUCCINATE (TOPROL-XL) 50 MG 24 HR TABLET    Take 1 tablet (50 mg total) by mouth daily. Takes 1/2-1 tablet daily   Allergies  Allergen Reactions  . Arnica Rash    Hives, blisters  . Duloxetine Hcl Other (See Comments)    Suicidal feelings  . Pseudoephedrine Hcl     Unknown per pt    Review of Systems  Constitutional: Negative.   HENT: Negative.   Eyes: Negative.   Respiratory:  Negative.   Cardiovascular: Negative.   Gastrointestinal: Positive for nausea and abdominal pain.  Endocrine: Negative.   Genitourinary: Negative.   Musculoskeletal: Negative.   Skin: Negative.   Allergic/Immunologic: Negative.   Neurological: Negative.   Hematological: Negative.   Psychiatric/Behavioral: Negative.     Social History  Substance Use Topics  . Smoking status: Never Smoker   . Smokeless tobacco: Never Used  . Alcohol Use: Yes     Comment: very rare   Objective:   BP 108/82 mmHg  Pulse 60  Temp(Src) 98.1 F (36.7 C) (Oral)  Resp 14  Wt 173 lb (78.472 kg)  Physical Exam  Constitutional: She is oriented to person, place, and time. She appears well-developed and well-nourished. No distress.  HENT:  Head: Normocephalic and atraumatic.  Right Ear: Hearing normal.  Left Ear: Hearing normal.  Nose: Nose normal.  Eyes: Conjunctivae and lids are normal. Right eye exhibits no discharge. Left eye exhibits no discharge. No scleral icterus.  Neck: Neck supple.  Cardiovascular: Normal rate and regular rhythm.   Pulmonary/Chest: Effort normal and breath sounds normal. No respiratory distress.  Abdominal: Soft. Bowel sounds are normal. She exhibits no mass. There is tenderness. There is no rebound.  Mild RUQ tenderness to deep palpation.  Musculoskeletal:  Normal range of motion.  Neurological: She is alert and oriented to person, place, and time.  Skin: Skin is intact. No lesion and no rash noted.  Psychiatric: She has a normal mood and affect. Her speech is normal and behavior is normal. Thought content normal.      Assessment & Plan:     1. RUQ abdominal pain RUQ abdominal pain with gas and some diarrhea over the past 2-3 weeks. Usually occurs after eating. Will get GB ultrasound and labs. Recommend low fat diet/bland with increased fluid intake. Recheck pending reports. No menses due to IUD - CBC with Differential/Platelet - Comprehensive metabolic panel -  Lipase - US Abdomen Limited RUQ

## 2016-03-20 NOTE — Patient Instructions (Signed)
Cholecystitis Cholecystitis is inflammation of the gallbladder. It is often called a gallbladder attack. The gallbladder is a pear-shaped organ that lies beneath the liver on the right side of the body. The gallbladder stores bile, which is a fluid that helps the body to digest fats. If bile builds up in your gallbladder, your gallbladder becomes inflamed. This condition may occur suddenly (be acute). Repeat episodes of acute cholecystitis or prolonged episodes may lead to a long-term (chronic) condition. Cholecystitis is serious and it requires treatment.  CAUSES The most common cause of this condition is gallstones. Gallstones can block the tube (duct) that carries bile out of your gallbladder. This causes bile to build up. Other causes of this condition include:  Damage to the gallbladder due to a decrease in blood flow.  Infections in the bile ducts.  Scars or kinks in the bile ducts.  Tumors in the liver, pancreas, or gallbladder. RISK FACTORS This condition is more likely to develop in:  People who have sickle cell disease.  People who take birth control pills or use estrogen.  People who have alcoholic liver disease.  People who have liver cirrhosis.  People who have their nutrition delivered through a vein (parenteral nutrition).  People who do not eat or drink (do fasting) for a long period of time.  People who are obese.  People who have rapid weight loss.  People who are pregnant.  People who have increased triglyceride levels.  People who have pancreatitis. SYMPTOMS Symptoms of this condition include:  Abdominal pain, especially in the upper right area of the abdomen.  Abdominal tenderness or bloating.  Nausea.  Vomiting.  Fever.  Chills.  Yellowing of the skin and the whites of the eyes (jaundice). DIAGNOSIS This condition is diagnosed with a medical history and physical exam. You may also have other tests, including:  Imaging tests, such as:  An  ultrasound of the gallbladder.  A CT scan of the abdomen.  A gallbladder nuclear scan (HIDA scan). This scan allows your health care provider to see the bile moving from your liver to your gallbladder and to your small intestine.  MRI.  Blood tests, such as:  A complete blood count, because the white blood cell count may be higher than normal.  Liver function tests, because some levels may be higher than normal with certain types of gallstones. TREATMENT Treatment may include:  Fasting for a certain amount of time.  IV fluids.  Medicine to treat pain or vomiting.  Antibiotic medicine.  Surgery to remove your gallbladder (cholecystectomy). This may happen immediately or at a later time. HOME CARE INSTRUCTIONS Home care will depend on your treatment. In general:  Take over-the-counter and prescription medicines only as told by your health care provider.  If you were prescribed an antibiotic medicine, take it as told by your health care provider. Do not stop taking the antibiotic even if you start to feel better.  Follow instructions from your health care provider about what to eat or drink. When you are allowed to eat, avoid eating or drinking anything that triggers your symptoms.  Keep all follow-up visits as told by your health care provider. This is important. SEEK MEDICAL CARE IF:  Your pain is not controlled with medicine.  You have a fever. SEEK IMMEDIATE MEDICAL CARE IF:  Your pain moves to another part of your abdomen or to your back.  You continue to have symptoms or you develop new symptoms even with treatment.   This information   is not intended to replace advice given to you by your health care provider. Make sure you discuss any questions you have with your health care provider.   Document Released: 12/01/2005 Document Revised: 08/22/2015 Document Reviewed: 03/14/2015 Elsevier Interactive Patient Education 2016 [employer]vier Inc.  

## 2016-03-21 ENCOUNTER — Telehealth: Payer: Self-pay

## 2016-03-21 LAB — COMPREHENSIVE METABOLIC PANEL WITH GFR
ALT: 13 IU/L (ref 0–32)
AST: 11 IU/L (ref 0–40)
Albumin/Globulin Ratio: 1.7 (ref 1.2–2.2)
Albumin: 4.6 g/dL (ref 3.5–5.5)
Alkaline Phosphatase: 76 IU/L (ref 39–117)
BUN/Creatinine Ratio: 14 (ref 9–23)
BUN: 12 mg/dL (ref 6–20)
Bilirubin Total: 0.5 mg/dL (ref 0.0–1.2)
CO2: 22 mmol/L (ref 18–29)
Calcium: 9.8 mg/dL (ref 8.7–10.2)
Chloride: 103 mmol/L (ref 96–106)
Creatinine, Ser: 0.85 mg/dL (ref 0.57–1.00)
GFR calc Af Amer: 103 mL/min/1.73
GFR calc non Af Amer: 90 mL/min/1.73
Globulin, Total: 2.7 g/dL (ref 1.5–4.5)
Glucose: 93 mg/dL (ref 65–99)
Potassium: 5 mmol/L (ref 3.5–5.2)
Sodium: 140 mmol/L (ref 134–144)
Total Protein: 7.3 g/dL (ref 6.0–8.5)

## 2016-03-21 LAB — CBC WITH DIFFERENTIAL/PLATELET
Basophils Absolute: 0.1 x10E3/uL (ref 0.0–0.2)
Basos: 1 %
EOS (ABSOLUTE): 0.8 x10E3/uL — ABNORMAL HIGH (ref 0.0–0.4)
Eos: 11 %
Hematocrit: 40.6 % (ref 34.0–46.6)
Hemoglobin: 13.7 g/dL (ref 11.1–15.9)
Immature Grans (Abs): 0 x10E3/uL (ref 0.0–0.1)
Immature Granulocytes: 0 %
Lymphocytes Absolute: 2 x10E3/uL (ref 0.7–3.1)
Lymphs: 26 %
MCH: 30 pg (ref 26.6–33.0)
MCHC: 33.7 g/dL (ref 31.5–35.7)
MCV: 89 fL (ref 79–97)
Monocytes Absolute: 0.6 x10E3/uL (ref 0.1–0.9)
Monocytes: 8 %
Neutrophils Absolute: 4.2 x10E3/uL (ref 1.4–7.0)
Neutrophils: 54 %
Platelets: 392 x10E3/uL — ABNORMAL HIGH (ref 150–379)
RBC: 4.56 x10E6/uL (ref 3.77–5.28)
RDW: 13.6 % (ref 12.3–15.4)
WBC: 7.7 x10E3/uL (ref 3.4–10.8)

## 2016-03-21 LAB — LIPASE: Lipase: 31 U/L (ref 0–59)

## 2016-03-21 NOTE — Telephone Encounter (Signed)
-----   Message from Margo Common, [place] sent at 03/21/2016  9:36 AM EDT ----- Normal liver and kidney function. No sign of infection and lipase (enzyme to check on liver/gallbladder function) normal. Proceed with ultrasound of gallbladder and bland low fat diet.

## 2016-03-21 NOTE — Telephone Encounter (Signed)
Patient advised as directed below. Patient verbalized understanding.  

## 2016-03-28 ENCOUNTER — Ambulatory Visit
Admission: RE | Admit: 2016-03-28 | Discharge: 2016-03-28 | Disposition: A | Discharge status: Home / Self Care | Payer: BC Managed Care – PPO | Source: Ambulatory Visit | Attending: Family Medicine | Admitting: Family Medicine

## 2016-03-28 DIAGNOSIS — R1011 Right upper quadrant pain: Secondary | ICD-10-CM | POA: Diagnosis present

## 2016-03-31 ENCOUNTER — Telehealth: Payer: Self-pay

## 2016-03-31 NOTE — Telephone Encounter (Signed)
-----   Message from Margo Common, [place] sent at 03/28/2016  1:42 PM EDT ----- Normal ultrasound without signs of gallbladder or liver disease. Kidney normal without signs of stones or cysts. If RUQ discomfort continues, will need referral to gastroenterologist.

## 2016-03-31 NOTE — Telephone Encounter (Signed)
LMTCB

## 2016-04-04 NOTE — Telephone Encounter (Signed)
Patient advised as directed below. Patient verbalized understanding.  

## 2016-04-18 ENCOUNTER — Telehealth: Payer: Self-pay

## 2016-04-18 DIAGNOSIS — R1013 Epigastric pain: Secondary | ICD-10-CM

## 2016-04-18 NOTE — Telephone Encounter (Signed)
Patient called and states she is still having abdominal pain and diarrhea, abdominal pain every time she eats. She wants to go ahead and proceed with GI referral-aa

## 2016-04-21 ENCOUNTER — Encounter: Payer: Self-pay | Admitting: Family Medicine

## 2016-04-21 ENCOUNTER — Ambulatory Visit (INDEPENDENT_AMBULATORY_CARE_PROVIDER_SITE_OTHER): Payer: BC Managed Care – PPO | Admitting: Family Medicine

## 2016-04-21 VITALS — BP 122/78 | HR 68 | Temp 98.2°F | Resp 16 | Wt 169.0 lb

## 2016-04-21 DIAGNOSIS — J0100 Acute maxillary sinusitis, unspecified: Secondary | ICD-10-CM

## 2016-04-21 DIAGNOSIS — J302 Other seasonal allergic rhinitis: Secondary | ICD-10-CM | POA: Diagnosis not present

## 2016-04-21 MED ORDER — AMOXICILLIN 500 MG PO CAPS: 500.0000 mg | ORAL_CAPSULE | Freq: Three times a day (TID) | ORAL | Status: DC

## 2016-04-21 MED ORDER — PREDNISONE 5 MG PO TABS: ORAL_TABLET | ORAL | Status: DC

## 2016-04-21 NOTE — Telephone Encounter (Signed)
Recurrent epigastric abdominal pain with diarrhea. Requests GI referral. Will schedule.

## 2016-04-21 NOTE — Progress Notes (Signed)
Patient ID: [name], female   DOB: 23-Sep-1981, 35 y.o.   MRN: [id number]   Patient: Norma Harris Female    DOB: [birth year]   35 y.o.   MRN: [id number] Visit Date: 04/21/2016  Today's Provider: Vernie Murders, PA   Chief Complaint  Patient presents with  . URI   Subjective:    URI  This is a new problem. The current episode started in the past 7 days. The problem has been unchanged. There has been no fever. Associated symptoms include congestion, coughing and a sore throat. Associated symptoms comments: Hoarseness . She has tried decongestant and acetaminophen for the symptoms. The treatment provided mild relief.  Cough and congestion started 2 days ago. Throat better until she coughs and talks a lot. Some sneezing and rhinorrhea mostly clear mucus at onset. Ears itching.  Past Medical History  Diagnosis Date  . Hypertension    Past Surgical History  Procedure Laterality Date  . Hernia repair    . Pilonidal cyst excision    . Laparoscopic ovarian    . Meniscus repair      and ACL repai-has screws/staples-RIGHT  . Vein ligation and stripping      x 7   Family History  Problem Relation Age of Onset  . Fibromyalgia Mother   . Hyperlipidemia Father   . Alzheimer's disease Maternal Grandmother   . Diabetes Maternal Grandfather   . Cancer Maternal Grandfather   . Lung cancer Paternal Grandfather    Previous Medications   CETIRIZINE (ZYRTEC) 10 MG TABLET    Take 10 mg by mouth daily.   IBUPROFEN (ADVIL,MOTRIN) 400 MG TABLET    Take 400 mg by mouth.   METOPROLOL SUCCINATE (TOPROL-XL) 50 MG 24 HR TABLET    Take 1 tablet (50 mg total) by mouth daily. Takes 1/2-1 tablet daily   Allergies  Allergen Reactions  . Arnica Rash    Hives, blisters  . Duloxetine Hcl Other (See Comments)    Suicidal feelings  . Pseudoephedrine Hcl     Unknown per pt    Review of Systems  Constitutional: Negative.   HENT: Positive for congestion, sore throat and voice change.   Eyes: Negative.     Respiratory: Positive for cough.   Cardiovascular: Negative.   Gastrointestinal: Negative.   Endocrine: Negative.   Genitourinary: Negative.   Musculoskeletal: Negative.   Skin: Negative.   Allergic/Immunologic: Negative.   Neurological: Negative.   Hematological: Negative.   Psychiatric/Behavioral: Negative.     Social History  Substance Use Topics  . Smoking status: Never Smoker   . Smokeless tobacco: Never Used  . Alcohol Use: Yes     Comment: very rare   Objective:   BP 122/78 mmHg  Pulse 68  Temp(Src) 98.2 F (36.8 C) (Oral)  Resp 16  Wt 169 lb (76.658 kg)  Physical Exam  Constitutional: She is oriented to person, place, and time. She appears well-developed and well-nourished. No distress.  HENT:  Head: Normocephalic and atraumatic.  Right Ear: Hearing and external ear normal.  Left Ear: Hearing and external ear normal.  Nose: Nose normal.  Slightly irritated posterior pharynx and nasal membranes. Tender maxillary sinuses with cloudy transillumination.  Eyes: Conjunctivae and lids are normal. Right eye exhibits no discharge. Left eye exhibits no discharge. No scleral icterus.  Neck: Neck supple.  Cardiovascular: Normal rate and regular rhythm.   Pulmonary/Chest: Effort normal and breath sounds normal. No respiratory distress.  Musculoskeletal: Normal range of motion.  Lymphadenopathy:  She has no cervical adenopathy.  Neurological: She is alert and oriented to person, place, and time.  Skin: Skin is intact. No lesion and no rash noted.  Psychiatric: She has a normal mood and affect. Her speech is normal and behavior is normal. Thought content normal.      Assessment & Plan:     1. Subacute maxillary sinusitis Onset over the past few days after flare of allergies. No fever but slight yellowish nasal mucus with sinus pressure. Increase fluid intake and recheck as needed. - amoxicillin (AMOXIL) 500 MG capsule; Take 1 capsule (500 mg total) by mouth 3 (three)  times daily.  Dispense: 30 capsule; Refill: 0  2. Other seasonal allergic rhinitis Flare over the past week. Continue Zyrtec daily and add steroid taper. Declines nose spray due to past nose bleed irritation from them. Recheck prn. - predniSONE (DELTASONE) 5 MG tablet; Taper by one tablet daily over 6 days. (6,5,4,3,2,1)  Dispense: 21 tablet; Refill: 0

## 2016-07-22 ENCOUNTER — Other Ambulatory Visit: Payer: Self-pay | Admitting: Family Medicine

## 2016-07-22 DIAGNOSIS — I10 Essential (primary) hypertension: Secondary | ICD-10-CM

## 2016-07-22 IMAGING — MR MR KNEE*R* W/O CM
5 of 6 series · 33 of 40 positions shown · non-contrast
Comparison: Multiple exams, including 03/09/2015 and 08/26/2010

CLINICAL DATA: Meniscal tear. ACL repair. Dancer. Weakness. Medial
knee pain.

EXAM:
MRI OF THE RIGHT KNEE WITHOUT CONTRAST
TECHNIQUE: Multiplanar, multisequence MR imaging of the knee was performed. No
intravenous contrast was administered.

[Series 3: PD fat-sat · axial · 3.0mm · 0.29mm/px · z∈[-115,+37]mm · 9 of 47 slices shown (1 of 3)]
[im 1/47]
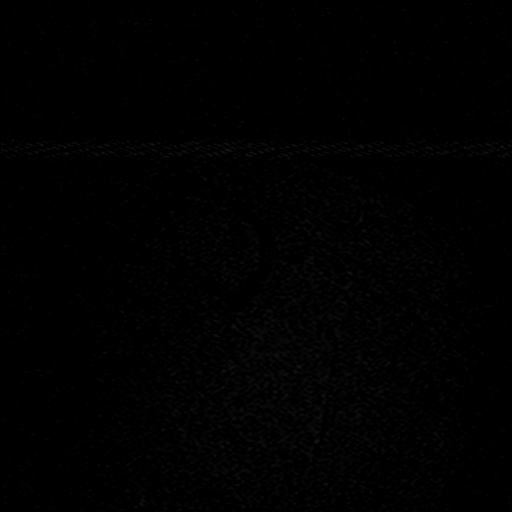
[im 6/47]
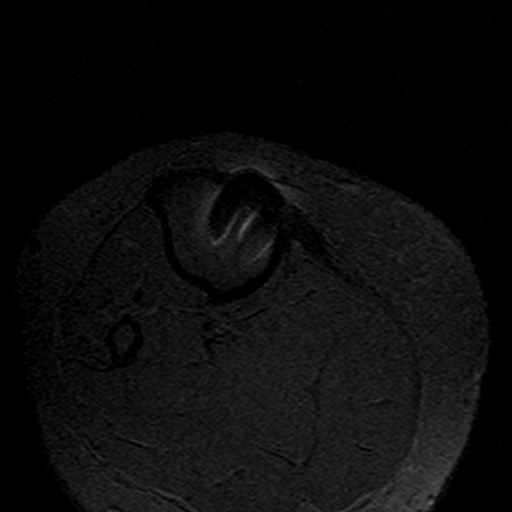
[im 12/47]
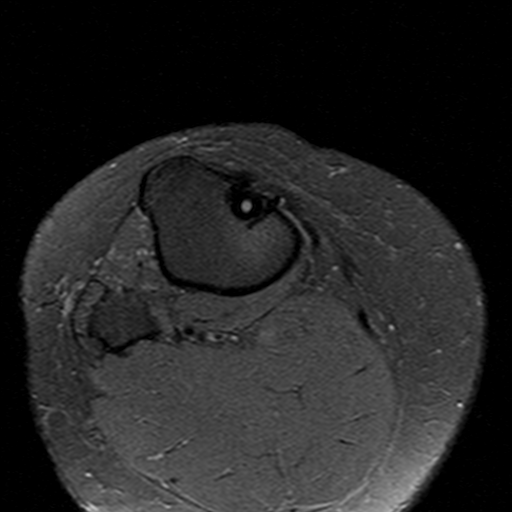
[im 18/47]
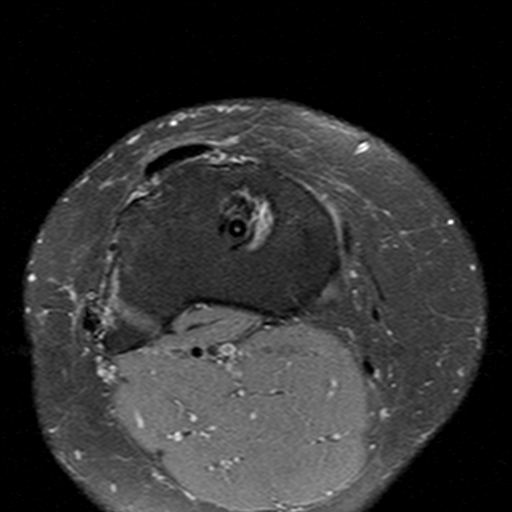
[im 24/47]
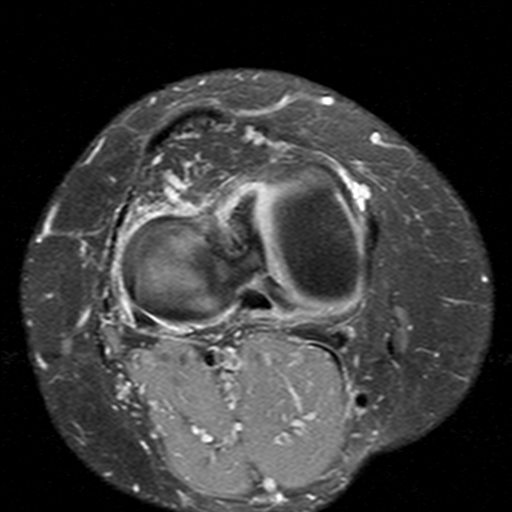
[im 29/47]
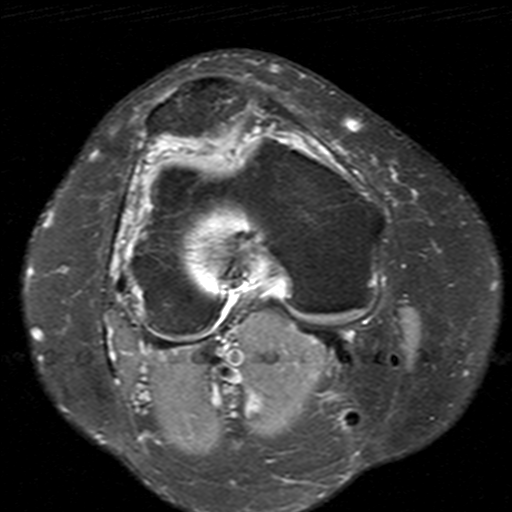
[im 35/47]
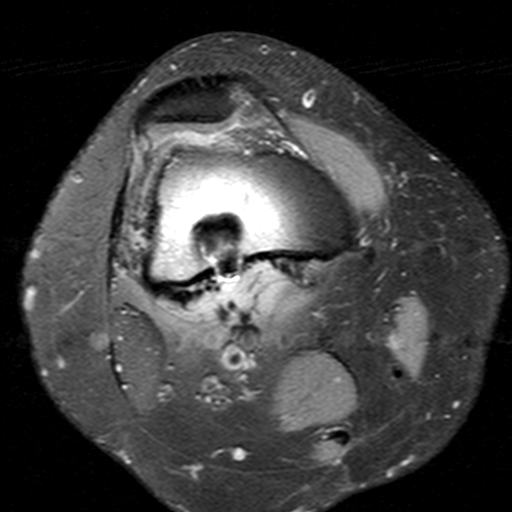
[im 41/47]
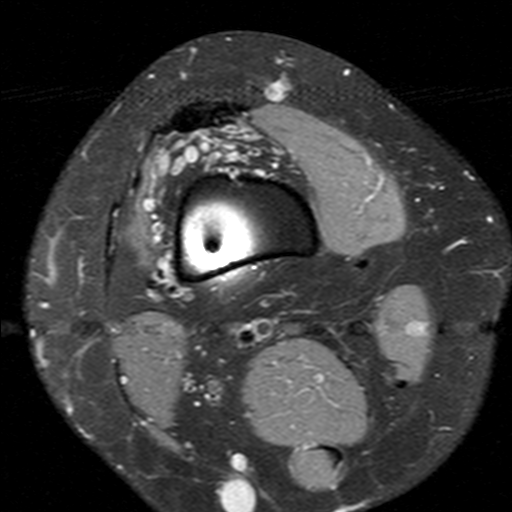
[im 47/47]
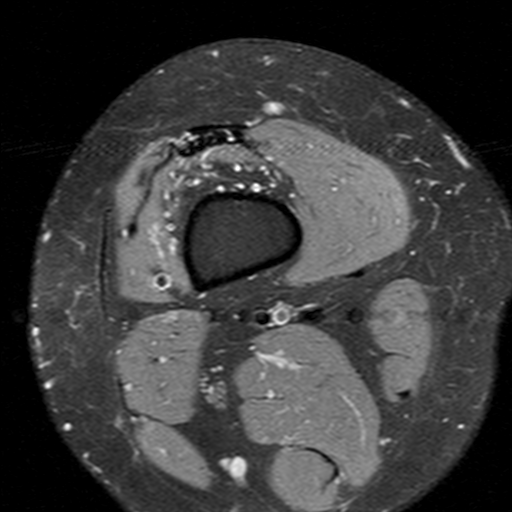

[Series 4: T2 fat-sat · coronal · 3.0mm · 0.62mm/px · 7 of 33 slices shown]
[im 1/33]
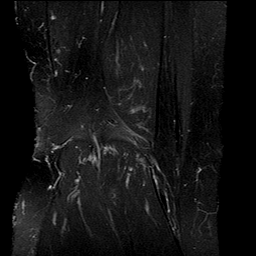
[im 6/33]
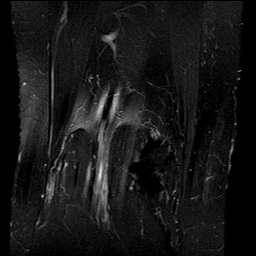
[im 11/33]
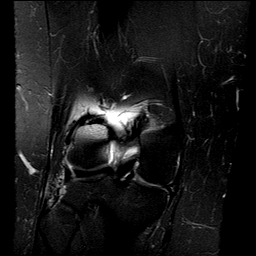
[im 17/33]
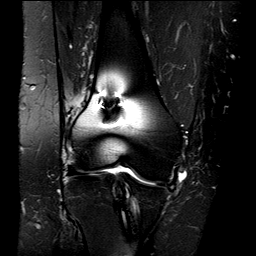
[im 22/33]
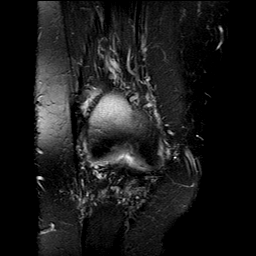
[im 27/33]
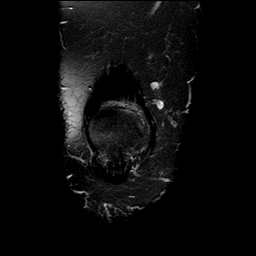
[im 33/33]
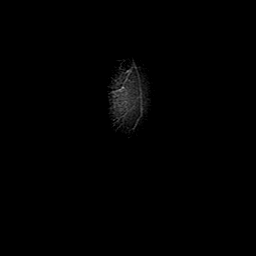

[Series 5: PD fat-sat · sagittal · 3.0mm · 0.70mm/px · 7 of 32 slices shown (2 of 3)]
[im 1/32]
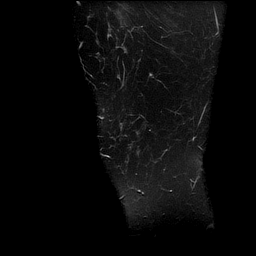
[im 6/32]
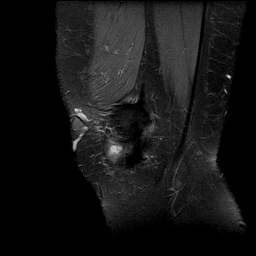
[im 11/32]
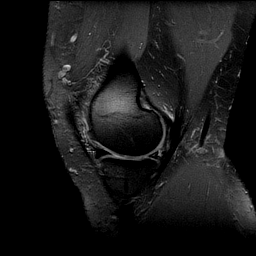
[im 16/32]
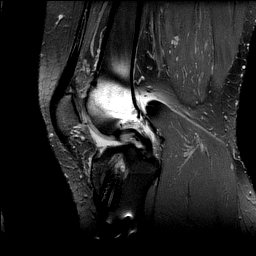
[im 21/32]
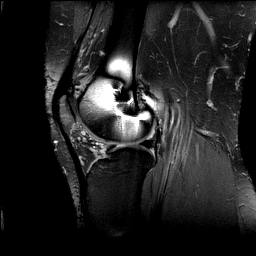
[im 26/32]
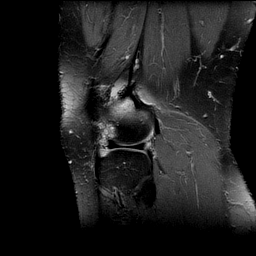
[im 32/32]
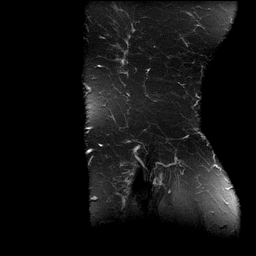

[Series 6: PD fat-sat · coronal · 3.0mm · 0.70mm/px · 7 of 33 slices shown (3 of 3)]
[im 1/33]
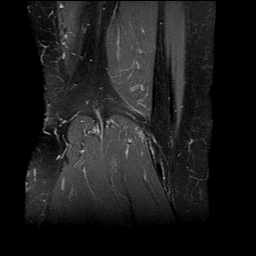
[im 6/33]
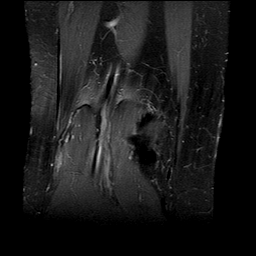
[im 11/33]
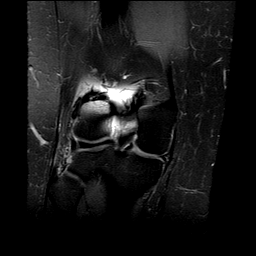
[im 17/33]
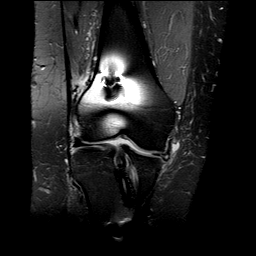
[im 22/33]
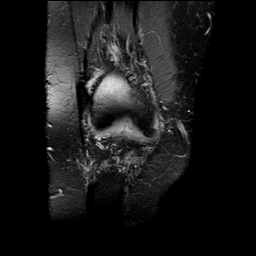
[im 27/33]
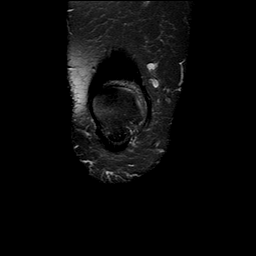
[im 33/33]
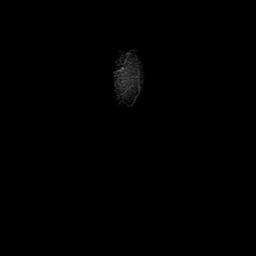

[Series 8: PD · coronal · 3.0mm · 0.31mm/px · 3 of 16 slices shown]
[im 1/16]
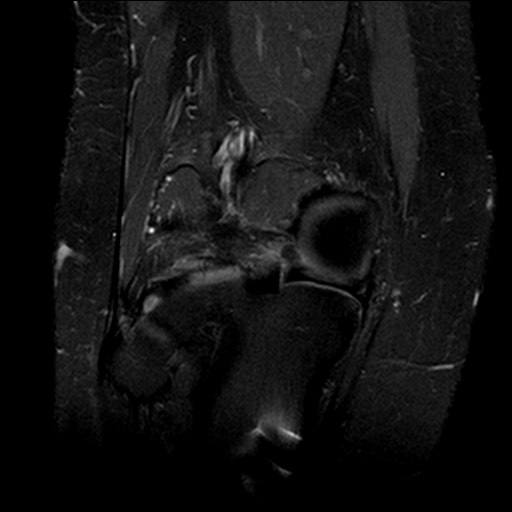
[im 8/16]
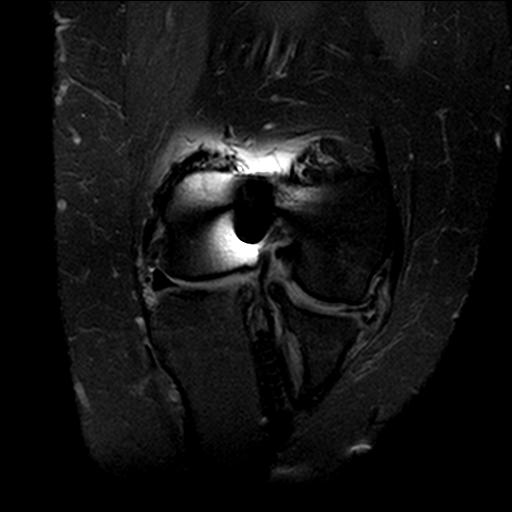
[im 16/16]
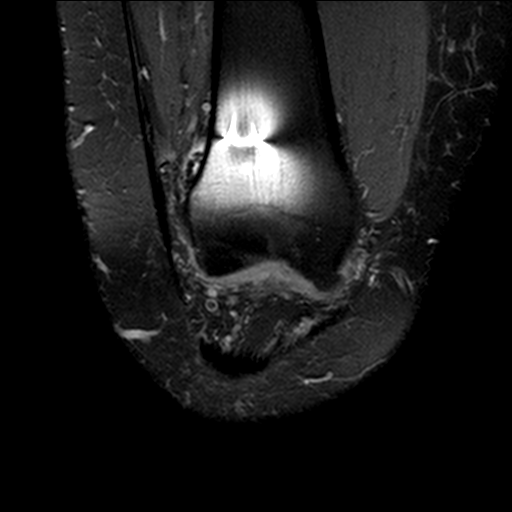

[33 of 40 positions shown; findings below may reference images not displayed]

FINDINGS: MENISCI

Medial meniscus: Diminutive posterior horn and midbody medial
meniscus likely related to partial meniscectomy. Horizontal grade 3
signal in the midbody extends to the residual free edge. Possible
small flap of meniscal tissue extending cephalad from the posterior
horn adjacent to the meniscal root on image 9 series 4.

Lateral meniscus:  Unremarkable

LIGAMENTS

Cruciates: The ACL graft appears intact. Metal artifact from
interference screws noted. PCL intact.

Collaterals:  Unremarkable

CARTILAGE

Patellofemoral: There is a new small focus of chondral edema along
the posterior patellar ridge, image 16 series 3. Chondral thinning
and mild chondral heterogeneity posteriorly in the femoral trochlear
groove.

Medial:  Mild degenerative chondral thinning.

Lateral:  Unremarkable

Joint: There is considerable vascular plethora with tortuous and
dilated vessels in the adipose tissue along the suprapatellar bursa
and in Hoffa's fat pad, and to a lesser extent in the marginal
intra-articular space.

Popliteal Fossa:  Very small Baker's cyst.

Extensor Mechanism:  Unremarkable

Bones: Marginal spurring, more prominent in the medial compartment
than the lateral compartment.
IMPRESSION: 1. Progressive osteoarthritis with marginal spurring in the medial
and lateral compartments, and chondral thinning in the medial
compartment. There is also some chondral thinning in the femoral
trochlear groove and a small focus of chondral edema along the
posterior patellar ridge.
2. Abnormal infiltrative dilated and tortuous vessels in the adipose
tissue lining the suprapatellar fat pad and in Hoffa's fat pad,
favoring infiltrative synovial hemangioma, with synovial hyperemia
due to synovitis as a possible but less likely differential
diagnostic consideration.
3. Prior partial medial meniscectomy. There is a small amount of
grade 3 horizontal signal in the midbody and postoperative
diminution in size of the posterior horn and midbody. There may be a
small meniscal flap extending cephalad from the proximal meniscal
root.
4. The ACL graft appears intact.

## 2016-07-24 ENCOUNTER — Other Ambulatory Visit: Payer: Self-pay | Admitting: Family Medicine

## 2016-07-24 DIAGNOSIS — I10 Essential (primary) hypertension: Secondary | ICD-10-CM

## 2016-07-24 NOTE — Telephone Encounter (Signed)
Pt contacted office for refill request on the following medications:  metoprolol succinate (TOPROL-XL) 50 MG 24 hr tablet  Last written: 06/08/15 Last OV: 04/21/16  Pt is going out of town and will run out of the medication. Please send to [facility]. Please advise. Thanks TNP

## 2016-09-17 ENCOUNTER — Other Ambulatory Visit: Payer: Self-pay | Admitting: Family Medicine

## 2016-09-17 DIAGNOSIS — I10 Essential (primary) hypertension: Secondary | ICD-10-CM

## 2016-10-22 ENCOUNTER — Telehealth: Payer: Self-pay | Admitting: Family Medicine

## 2016-10-22 NOTE — Telephone Encounter (Signed)
Patient has been advised that you are out of the office and will return in morning. KW

## 2016-10-22 NOTE — Telephone Encounter (Signed)
Pt called wanting to discuss with Simona Huh about her visit with [facility] regarding prescribing pain medication.  Pt's call back is [phone]  Thanks Con Memos

## 2016-10-24 MED ORDER — DICLOFENAC SODIUM 75 MG PO TBEC: 75.0000 mg | DELAYED_RELEASE_TABLET | Freq: Two times a day (BID) | ORAL | 0 refills | Status: DC

## 2016-10-24 NOTE — Telephone Encounter (Signed)
Vascular surgeon at [facility] asked patient to check with this office regarding medication for inflammation and nocturnal ache in legs due to past 7 surgeries and swelling in the right leg for chronic venous insufficiency with large varicose veins. Uses compression hose during the day but he did not want her to use them at night. Will give Diclofenac 75 mg BID prn pain and schedule follow up appointment. Patient agrees with plan.

## 2016-10-28 ENCOUNTER — Other Ambulatory Visit: Payer: Self-pay | Admitting: Family Medicine

## 2016-10-28 DIAGNOSIS — I10 Essential (primary) hypertension: Secondary | ICD-10-CM

## 2016-12-16 ENCOUNTER — Ambulatory Visit (INDEPENDENT_AMBULATORY_CARE_PROVIDER_SITE_OTHER): Payer: BC Managed Care – PPO | Admitting: Family Medicine

## 2016-12-16 ENCOUNTER — Encounter: Payer: Self-pay | Admitting: Family Medicine

## 2016-12-16 VITALS — BP 122/72 | HR 64 | Temp 98.2°F | Resp 16

## 2016-12-16 DIAGNOSIS — F4323 Adjustment disorder with mixed anxiety and depressed mood: Secondary | ICD-10-CM

## 2016-12-16 MED ORDER — ESCITALOPRAM OXALATE 10 MG PO TABS: 10.0000 mg | ORAL_TABLET | Freq: Every day | ORAL | 3 refills | Status: DC

## 2016-12-16 NOTE — Progress Notes (Signed)
Patient: Norma Harris Female    DOB: [birth year]   36 y.o.   MRN: [id number] Visit Date: 12/16/2016  Today's Provider: Vernie Murders, PA   Chief Complaint  Patient presents with  . Depression   Subjective:    Depression         This is a recurrent problem.  Episode onset: for the last year.   The problem has been gradually worsening since onset.  Associated symptoms include decreased concentration ("feel like I'm in a fog"), fatigue, hopelessness, insomnia, irritable, restlessness, decreased interest and sad.  Associated symptoms include no helplessness, no appetite change, no body aches, no headaches, no indigestion and no suicidal ideas.     The symptoms are aggravated by nothing.  Treatments tried: Pt has tried Cymbalta in the past, without relief. Pt reports her depression is causing anxiety and panic attacks. Pt sees Loletha Grayer for counseling (through church). Pt reports the counseling is helping sx some.  Past Medical History:  Diagnosis Date  . Hypertension    Patient Active Problem List   Diagnosis Date Noted  . Clinical depression 06/07/2015  . Endometriosis 06/07/2015  . Fatigue 06/07/2015  . BP (high blood pressure) 06/07/2015  . Anxiety 11/20/2014  . 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 Cardiac murmur 06/14/2012   Past Surgical History:  Procedure Laterality Date  . HERNIA REPAIR    . LAPAROSCOPIC OVARIAN    . MENISCUS REPAIR     and ACL repai-has screws/staples-RIGHT  . PILONIDAL CYST EXCISION    . VEIN LIGATION AND STRIPPING     x 7   Family History  Problem Relation Age of Onset  . Fibromyalgia Mother   . Hyperlipidemia Father   . Alzheimer's disease Maternal Grandmother   . Lung cancer Paternal Grandfather   . Diabetes Maternal Grandfather   .  Cancer Maternal Grandfather    Allergies  Allergen Reactions  . Arnica Rash    Hives, blisters  . Duloxetine Hcl Other (See Comments)    Suicidal feelings  . Pseudoephedrine Hcl     Unknown per pt    Current Outpatient Prescriptions:  .  ibuprofen (ADVIL,MOTRIN) 800 MG tablet, TK 1 T PO TID FOR 7 DAYS, Disp: , Rfl: 0 .  metoprolol succinate (TOPROL-XL) 50 MG 24 hr tablet, TAKE 1/2 TO 1 TABLET EVERY DAY, Disp: 30 tablet, Rfl: 0 .  oxyCODONE (OXY IR/ROXICODONE) 5 MG immediate release tablet, TK 1 T PO Q 4 H FOR 7 DAYS, Disp: , Rfl: 0 .  cetirizine (ZYRTEC) 10 MG tablet, Take 10 mg by mouth daily., Disp: , Rfl:  .  diclofenac (VOLTAREN) 75 MG EC tablet, Take 1 tablet (75 mg total) by mouth 2 (two) times daily. (Patient not taking: Reported on 12/16/2016), Disp: 30 tablet, Rfl: 0  Review of Systems  Constitutional: Positive for fatigue. Negative for appetite change.  Neurological: Negative for headaches.  Psychiatric/Behavioral: Positive for decreased concentration ("feel like I'm in a fog") and depression. Negative for suicidal ideas. The patient has insomnia.     Social History  Substance Use Topics  . Smoking status: Never Smoker  . Smokeless tobacco: Never Used  . Alcohol use Yes     Comment: very rare   Objective:   BP 122/72 (BP Location: Right Arm, Patient Position: Sitting, Cuff Size: Normal)  Pulse 64   Temp 98.2 F (36.8 C) (Oral)   Resp 16   Physical Exam  Constitutional: She is oriented to person, place, and time. She appears well-developed and well-nourished. She is irritable. No distress.  HENT:  Head: Normocephalic and atraumatic.  Right Ear: Hearing normal.  Left Ear: Hearing normal.  Nose: Nose normal.  Eyes: Conjunctivae and lids are normal. Right eye exhibits no discharge. Left eye exhibits no discharge. No scleral icterus.  Cardiovascular: Normal rate and regular rhythm.   Pulmonary/Chest: Effort normal. No respiratory distress.  Abdominal: Soft. Bowel  sounds are normal.  Musculoskeletal: Normal range of motion.  Neurological: She is alert and oriented to person, place, and time.  Skin: Skin is intact. No lesion and no rash noted.  Psychiatric: Her speech is normal. Thought content normal. Her mood appears anxious. She is agitated. She exhibits a depressed mood. She expresses no suicidal plans and no homicidal plans.  Wake up frequently at night after going to sleep initially.      Assessment & Plan:     1. Adjustment reaction with anxiety and depression Onset with mood swings of sadness and irritability/anxiety over the past several months. Will given Lexapro and recheck in 1 month. - escitalopram (LEXAPRO) 10 MG tablet; Take 1 tablet (10 mg total) by mouth daily.  Dispense: 30 tablet; Refill: 3     Patient seen and examined by Vernie Murders, PA, and note scribed by Renaldo Fiddler, CMA.  Vernie Murders, PA  [facility]

## 2016-12-29 ENCOUNTER — Other Ambulatory Visit: Payer: Self-pay | Admitting: Family Medicine

## 2016-12-29 DIAGNOSIS — I10 Essential (primary) hypertension: Secondary | ICD-10-CM

## 2016-12-30 NOTE — Telephone Encounter (Signed)
Ok to refill 

## 2017-01-13 ENCOUNTER — Encounter: Payer: Self-pay | Admitting: Family Medicine

## 2017-01-13 ENCOUNTER — Ambulatory Visit (INDEPENDENT_AMBULATORY_CARE_PROVIDER_SITE_OTHER): Payer: BC Managed Care – PPO | Admitting: Family Medicine

## 2017-01-13 VITALS — BP 110/70 | HR 68 | Temp 98.4°F | Resp 16 | Wt 170.0 lb

## 2017-01-13 DIAGNOSIS — G479 Sleep disorder, unspecified: Secondary | ICD-10-CM | POA: Diagnosis not present

## 2017-01-13 DIAGNOSIS — F4323 Adjustment disorder with mixed anxiety and depressed mood: Secondary | ICD-10-CM

## 2017-01-13 MED ORDER — ESCITALOPRAM OXALATE 20 MG PO TABS: 20.0000 mg | ORAL_TABLET | Freq: Every day | ORAL | 3 refills | Status: DC

## 2017-01-13 MED ORDER — HYDROXYZINE HCL 25 MG PO TABS: 25.0000 mg | ORAL_TABLET | Freq: Every evening | ORAL | 0 refills | Status: DC | PRN

## 2017-01-13 NOTE — Progress Notes (Signed)
Patient: Norma Harris Female    DOB: [birth year]   36 y.o.   MRN: [id number] Visit Date: 01/13/2017  Today's Provider: Vernie Murders, PA   Chief Complaint  Patient presents with  . Anxiety   Subjective:    HPI  Anxiety, Follow-up  She  was last seen for this 4 weeks ago. Changes made at last visit include starting Lexapro 10 mg qd.   She reports excellent compliance with treatment. She is not having side effects.   She reports good tolerance of treatment. Current symptoms include: depressed mood, difficulty concentrating and insomnia She feels she is Unchanged since last visit.  ------------------------------------------------------------------------   Family History  Problem Relation Age of Onset  . Fibromyalgia Mother   . Hyperlipidemia Father   . Alzheimer's disease Maternal Grandmother   . Lung cancer Paternal Grandfather   . Diabetes Maternal Grandfather   . Cancer Maternal Grandfather    Past Surgical History:  Procedure Laterality Date  . HERNIA REPAIR    . LAPAROSCOPIC OVARIAN    . MENISCUS REPAIR     and ACL repai-has screws/staples-RIGHT  . PILONIDAL CYST EXCISION    . VEIN LIGATION AND STRIPPING     x 7     Allergies  Allergen Reactions  . Arnica Rash    Hives, blisters  . Duloxetine Hcl Other (See Comments)    Suicidal feelings  . Pseudoephedrine Hcl     Unknown per pt     Current Outpatient Prescriptions:  .  cetirizine (ZYRTEC) 10 MG tablet, Take 10 mg by mouth daily., Disp: , Rfl:  .  escitalopram (LEXAPRO) 10 MG tablet, Take 1 tablet (10 mg total) by mouth daily., Disp: 30 tablet, Rfl: 3 .  ibuprofen (ADVIL,MOTRIN) 800 MG tablet, TK 1 T PO TID FOR 7 DAYS, Disp: , Rfl: 0 .  metoprolol succinate (TOPROL-XL) 50 MG 24 hr tablet, TAKE 1/2 TO 1 TABLET BY MOUTH EVERY DAY, Disp: 30 tablet, Rfl: 6  Review of Systems  Constitutional: Negative.   Cardiovascular: Negative.   Psychiatric/Behavioral: Positive for agitation, decreased  concentration and sleep disturbance. The patient is nervous/anxious.     Social History  Substance Use Topics  . Smoking status: Never Smoker  . Smokeless tobacco: Never Used  . Alcohol use Yes     Comment: very rare   Objective:   BP 110/70 (BP Location: Right Arm, Patient Position: Sitting, Cuff Size: Large)   Pulse 68   Temp 98.4 F (36.9 C) (Oral)   Resp 16   Wt 170 lb (77.1 kg)   BMI 28.29 kg/m  Wt Readings from Last 3 Encounters:  01/13/17 170 lb (77.1 kg)  04/21/16 169 lb (76.7 kg)  03/20/16 173 lb (78.5 kg)    Physical Exam  Constitutional: She is oriented to person, place, and time. She appears well-developed and well-nourished.  HENT:  Head: Normocephalic.  Eyes: Conjunctivae are normal.  Neck: Neck supple.  Cardiovascular: Normal rate and regular rhythm.   Pulmonary/Chest: Effort normal and breath sounds normal.  Abdominal: Soft. Bowel sounds are normal.  Musculoskeletal: Normal range of motion.  Neurological: She is alert and oriented to person, place, and time.  Psychiatric: Her speech is normal. Thought content normal. Her mood appears anxious. She is agitated and aggressive. She expresses no homicidal and no suicidal ideation. She expresses no suicidal plans and no homicidal plans.      Assessment & Plan:     1. Adjustment reaction with  anxiety and depression Been on the Lexapro 10 mg qd for the past month and seeing only mild response. Still feeling irritable and waking up for 1-2 hours during the night. Appetite has been diminished. No crying spells or panic attacks. Will increase Lexapro to 20 mg qd and give Hydroxyzine for insomnia. Recommend she eat 3 nutritious meals, exercise regularly and continue counseling with Arma Heading. Recheck in 6 weeks. - escitalopram (LEXAPRO) 20 MG tablet; Take 1 tablet (20 mg total) by mouth daily.  Dispense: 30 tablet; Refill: 3  2. Sleep disturbance Go to bed around 11:00 pm and wake up at 2:00 am. Stay awake for 2  hours before getting up at 5:00 am. Feeling fatigued and more irritable. Will give Vistaril and recheck in 6 weeks. - hydrOXYzine (ATARAX/VISTARIL) 25 MG tablet; Take 1 tablet (25 mg total) by mouth at bedtime as needed.  Dispense: 30 tablet; Refill: [place], [place]  [facility]

## 2017-02-03 ENCOUNTER — Encounter: Payer: Self-pay | Admitting: Family Medicine

## 2017-02-03 ENCOUNTER — Ambulatory Visit (INDEPENDENT_AMBULATORY_CARE_PROVIDER_SITE_OTHER): Payer: BC Managed Care – PPO | Admitting: Family Medicine

## 2017-02-03 VITALS — BP 138/84 | HR 83 | Temp 98.7°F | Resp 16 | Wt 173.4 lb

## 2017-02-03 DIAGNOSIS — J0140 Acute pansinusitis, unspecified: Secondary | ICD-10-CM | POA: Diagnosis not present

## 2017-02-03 MED ORDER — DM-GUAIFENESIN ER 30-600 MG PO TB12: 1.0000 | ORAL_TABLET | Freq: Two times a day (BID) | ORAL | 0 refills | Status: DC

## 2017-02-03 MED ORDER — AMOXICILLIN 875 MG PO TABS: 875.0000 mg | ORAL_TABLET | Freq: Two times a day (BID) | ORAL | 0 refills | Status: DC

## 2017-02-03 NOTE — Patient Instructions (Signed)

## 2017-02-03 NOTE — Progress Notes (Signed)
Patient: Norma Harris Female    DOB: Dec 26, 1980   36 y.o.   MRN: [id number] Visit Date: 02/03/2017  Today's Provider: Vernie Murders, PA   Chief Complaint  Patient presents with  . Cough  . Nasal Congestion  . Sore Throat  . Facial Pain   Subjective:    URI   This is a new problem. Episode onset: Wednesday. The problem has been gradually worsening. There has been no fever. Associated symptoms include congestion, coughing, sinus pain and a sore throat. Treatments tried: Zicam and Ibuprofen. The treatment provided mild relief.   Past Medical History:  Diagnosis Date  . Hypertension    Patient Active Problem List   Diagnosis Date Noted  . Clinical depression 06/07/2015  . Endometriosis 06/07/2015  . Fatigue 06/07/2015  . BP (high blood pressure) 06/07/2015  . Anxiety 11/20/2014  . 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 Cardiac murmur 06/14/2012   Past Surgical History:  Procedure Laterality Date  . HERNIA REPAIR    . LAPAROSCOPIC OVARIAN    . MENISCUS REPAIR     and ACL repai-has screws/staples-RIGHT  . PILONIDAL CYST EXCISION    . VEIN LIGATION AND STRIPPING     x 7   Family History  Problem Relation Age of Onset  . Fibromyalgia Mother   . Hyperlipidemia Father   . Alzheimer's disease Maternal Grandmother   . Lung cancer Paternal Grandfather   . Diabetes Maternal Grandfather   . Cancer Maternal Grandfather    Allergies  Allergen Reactions  . Arnica Rash    Hives, blisters  . Duloxetine Hcl Other (See Comments)    Suicidal feelings  . Pseudoephedrine Hcl     Unknown per pt     Previous Medications   CETIRIZINE (ZYRTEC) 10 MG TABLET    Take 10 mg by mouth daily.   ESCITALOPRAM (LEXAPRO) 20 MG TABLET    Take 1 tablet (20 mg total) by mouth daily.   HYDROXYZINE  (ATARAX/VISTARIL) 25 MG TABLET    Take 1 tablet (25 mg total) by mouth at bedtime as needed.   IBUPROFEN (ADVIL,MOTRIN) 800 MG TABLET    TK 1 T PO TID FOR 7 DAYS   METOPROLOL SUCCINATE (TOPROL-XL) 50 MG 24 HR TABLET    TAKE 1/2 TO 1 TABLET BY MOUTH EVERY DAY   SIROLIMUS (RAPAMUNE) 1 MG TABLET        Review of Systems  Constitutional: Negative.   HENT: Positive for congestion, sinus pain and sore throat.   Respiratory: Positive for cough.   Cardiovascular: Negative.     Social History  Substance Use Topics  . Smoking status: Never Smoker  . Smokeless tobacco: Never Used  . Alcohol use Yes     Comment: very rare   Objective:   BP 138/84 (BP Location: Right Arm, Patient Position: Sitting, Cuff Size: Normal)   Pulse 83   Temp 98.7 F (37.1 C) (Oral)   Resp 16   Wt 173 lb 6.4 oz (78.7 kg)   SpO2 99%   BMI 28.86 kg/m   Physical Exam  Constitutional: She is oriented to person, place, and time. She appears well-developed and well-nourished. No distress.  HENT:  Head: Normocephalic and atraumatic.  Right Ear: Hearing and external ear normal.  Left Ear: Hearing and external ear normal.  Nose: Nose normal.  Reddened nasal membranes  and tender sinuses to palpate. Slightly reddened tonsillar pillars without exudates.  Eyes: Conjunctivae and lids are normal. Right eye exhibits no discharge. Left eye exhibits no discharge. No scleral icterus.  Neck: Neck supple.  Cardiovascular: Normal rate.   Pulmonary/Chest: Effort normal and breath sounds normal. No respiratory distress.  Abdominal: Soft. Bowel sounds are normal.  Musculoskeletal: Normal range of motion.  Neurological: She is alert and oriented to person, place, and time.  Skin: Skin is intact. No lesion and no rash noted.  Psychiatric: She has a normal mood and affect. Her speech is normal and behavior is normal. Thought content normal.      Assessment & Plan:     1. Acute pansinusitis, recurrence not specified Onset over  the past 6-7 days. Initially started with sore throat and progressed to cough with sinus pressure pain. May use Advil prn. Increase fluids. Add antibiotic and Mucinex-DM. Recheck prn. - amoxicillin (AMOXIL) 875 MG tablet; Take 1 tablet (875 mg total) by mouth 2 (two) times daily.  Dispense: 20 tablet; Refill: 0 - dextromethorphan-guaiFENesin (MUCINEX DM) 30-600 MG 12hr tablet; Take 1 tablet by mouth 2 (two) times daily.  Dispense: 20 tablet; Refill: 0

## 2017-02-10 ENCOUNTER — Other Ambulatory Visit: Payer: Self-pay | Admitting: Family Medicine

## 2017-02-10 DIAGNOSIS — G479 Sleep disorder, unspecified: Secondary | ICD-10-CM

## 2017-02-24 ENCOUNTER — Ambulatory Visit (INDEPENDENT_AMBULATORY_CARE_PROVIDER_SITE_OTHER): Payer: BC Managed Care – PPO | Admitting: Family Medicine

## 2017-02-24 ENCOUNTER — Encounter: Payer: Self-pay | Admitting: Family Medicine

## 2017-02-24 VITALS — BP 112/64 | HR 62 | Temp 98.8°F | Wt 176.2 lb

## 2017-02-24 DIAGNOSIS — Q872 Congenital malformation syndromes predominantly involving limbs: Secondary | ICD-10-CM | POA: Diagnosis not present

## 2017-02-24 DIAGNOSIS — F418 Other specified anxiety disorders: Secondary | ICD-10-CM

## 2017-02-24 HISTORY — DX: Other specified anxiety disorders: F41.8

## 2017-02-24 NOTE — Progress Notes (Signed)
Patient: Norma Harris Female    DOB: 05-14-1981   36 y.o.   MRN: [id number] Visit Date: 02/24/2017  Today's Provider: Vernie Murders, PA   Chief Complaint  Patient presents with  . Anxiety  . Follow-up   Subjective:    HPI Anxiety:  Patient is here for a 6 week follow up. Last OV was 01/13/2017. Patient was advised to increase Lexapro to 20 mg and start Hydroxyzine for insomnia. Patient reports good compliance with treatment plan. Patient was also advised to start eating three nutritious meals per day, exercise regularly and continue counseling sessions. Patient reports fair compliance with recommendations. Patient states symptoms are stable at this time.     Previous Medications   CETIRIZINE (ZYRTEC) 10 MG TABLET    Take 10 mg by mouth daily.   ESCITALOPRAM (LEXAPRO) 20 MG TABLET    Take 1 tablet (20 mg total) by mouth daily.   HYDROXYZINE (ATARAX/VISTARIL) 25 MG TABLET    TAKE 1 TABLET(25 MG) BY MOUTH AT BEDTIME AS NEEDED   IBUPROFEN (ADVIL,MOTRIN) 800 MG TABLET    TK 1 T PO TID FOR 7 DAYS   METOPROLOL SUCCINATE (TOPROL-XL) 50 MG 24 HR TABLET    TAKE 1/2 TO 1 TABLET BY MOUTH EVERY DAY   SIROLIMUS (RAPAMUNE) 1 MG TABLET        Review of Systems  Constitutional: Negative.   Respiratory: Negative.   Cardiovascular: Negative.     Social History  Substance Use Topics  . Smoking status: Never Smoker  . Smokeless tobacco: Never Used  . Alcohol use Yes     Comment: very rare   Objective:   BP 112/64 (BP Location: Right Arm, Patient Position: Sitting, Cuff Size: Normal)   Pulse 62   Temp 98.8 F (37.1 C) (Oral)   Wt 176 lb 3.2 oz (79.9 kg)   SpO2 98%   BMI 29.32 kg/m   Physical Exam  Constitutional: She is oriented to person, place, and time. She appears well-developed and well-nourished. No distress.  HENT:  Head: Normocephalic and atraumatic.  Right Ear: Hearing normal.  Left Ear: Hearing normal.  Nose: Nose normal.  Eyes: Conjunctivae and lids are normal. Right  eye exhibits no discharge. Left eye exhibits no discharge. No scleral icterus.  Neck: Neck supple.  Cardiovascular: Normal rate and regular rhythm.   Pulmonary/Chest: Effort normal. No respiratory distress.  Abdominal: Soft. Bowel sounds are normal.  Musculoskeletal: Normal range of motion.  Neurological: She is alert and oriented to person, place, and time.  Skin: Skin is intact. No lesion and no rash noted.  Psychiatric: Her speech is normal and behavior is normal. Thought content normal. Her mood appears anxious. She exhibits a depressed mood.   Depression screen PHQ 2/9 02/24/2017  Decreased Interest 1  Down, Depressed, Hopeless 1  PHQ - 2 Score 2  Altered sleeping 0  Tired, decreased energy 0  Change in appetite 3  Feeling bad or failure about yourself  1  Trouble concentrating 0  Moving slowly or fidgety/restless 1  Suicidal thoughts 1  PHQ-9 Score 8      Assessment & Plan:     1. Depression with anxiety Feeling better with the use of Vistaril to help with sleep disturbance. Continues to take the Lexapro 20 mg qd and able to cope better without significant anxiety now. Will continue present dosages and counseling with Arma Heading. Recheck in 3 months.   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 Presently on Rapamune 1 mg qd and followed by Dr. Job Founds [facility] [phone]) who advised watching for vascular malformations and treat as follows: Local intralesional clotting (LIC) which can cause pain and/or pulmonary emboli. With increased pain, should go to the ER at [facility]. General pediatrics handles admissions with Dr. Job Founds in consultation during the week as needed. Evaluation would include repeat D-dimers, fibrinogen, CBC with diff and platelet  count. Treatment can range from symptomatic pain management to LMWH as the preferential anticoagulant for a few days or steroids. Redness/swelling of overlying skin is probably an inflammatory reaction but generally treated as infection with po or IV Ampicillin or Clindamycin for 7-10 days. Steroids and anticoagulation as above. Use warm soaks to the area andcompression garments for superficial lesions. ASA probably not useful. Advised patient to continue follow up with Dr. Job Founds.

## 2017-03-09 ENCOUNTER — Other Ambulatory Visit: Payer: Self-pay | Admitting: Family Medicine

## 2017-03-09 DIAGNOSIS — G479 Sleep disorder, unspecified: Secondary | ICD-10-CM

## 2017-04-12 ENCOUNTER — Other Ambulatory Visit: Payer: Self-pay | Admitting: Family Medicine

## 2017-04-12 DIAGNOSIS — G479 Sleep disorder, unspecified: Secondary | ICD-10-CM

## 2017-04-12 DIAGNOSIS — J0140 Acute pansinusitis, unspecified: Secondary | ICD-10-CM

## 2017-05-11 ENCOUNTER — Other Ambulatory Visit: Payer: Self-pay | Admitting: Family Medicine

## 2017-05-11 DIAGNOSIS — F4323 Adjustment disorder with mixed anxiety and depressed mood: Secondary | ICD-10-CM

## 2017-05-11 DIAGNOSIS — G479 Sleep disorder, unspecified: Secondary | ICD-10-CM

## 2017-05-13 ENCOUNTER — Telehealth: Payer: Self-pay | Admitting: Family Medicine

## 2017-05-13 DIAGNOSIS — F4321 Adjustment disorder with depressed mood: Secondary | ICD-10-CM

## 2017-05-13 MED ORDER — ALPRAZOLAM 0.25 MG PO TABS: 0.2500 mg | ORAL_TABLET | Freq: Two times a day (BID) | ORAL | 0 refills | Status: DC | PRN

## 2017-05-13 NOTE — Telephone Encounter (Signed)
Patient has appt scheduled on 05/29/17.

## 2017-05-13 NOTE — Telephone Encounter (Signed)
Pt called saying her father passed Monday and she wants to know if there is something that can be called in for her for anxiety and panic attacks to help her right now.  Her call back is [phone]  She uses [employer].  ThanksTeri

## 2017-05-13 NOTE — Telephone Encounter (Signed)
Alprazolam rx printed but she is going to need appt to discuss further for more refills if needed.

## 2017-05-13 NOTE — Telephone Encounter (Signed)
Please advise 

## 2017-05-29 ENCOUNTER — Ambulatory Visit: Payer: BC Managed Care – PPO | Admitting: Family Medicine

## 2017-05-29 NOTE — Progress Notes (Deleted)
   Patient: Norma Harris Female    DOB: 1981-03-02   36 y.o.   MRN: [id number] Visit Date: 05/29/2017  Today's Provider: Vernie Murders, PA   No chief complaint on file.  Subjective:    HPI Anxiety:  Patient is here for 3 month follow up. Last OV was 02/24/2017. Symptoms were stable. Patient was advised to continue Lexapro to 20 mg and continue Vistaril for insomnia, also continue counseling with Arma Heading. Patient reports good compliance with treatment plan.    Previous Medications   ALPRAZOLAM (XANAX) 0.25 MG TABLET    Take 1 tablet (0.25 mg total) by mouth 2 (two) times daily as needed for anxiety.   CETIRIZINE (ZYRTEC) 10 MG TABLET    Take 10 mg by mouth daily.   ESCITALOPRAM (LEXAPRO) 20 MG TABLET    TAKE 1 TABLET(20 MG) BY MOUTH DAILY   HYDROXYZINE (ATARAX/VISTARIL) 25 MG TABLET    TAKE 1 TABLET(25 MG) BY MOUTH AT BEDTIME AS NEEDED   IBUPROFEN (ADVIL,MOTRIN) 800 MG TABLET    TK 1 T PO TID FOR 7 DAYS   METOPROLOL SUCCINATE (TOPROL-XL) 50 MG 24 HR TABLET    TAKE 1/2 TO 1 TABLET BY MOUTH EVERY DAY   SIROLIMUS (RAPAMUNE) 1 MG TABLET        Review of Systems  Constitutional: Negative.   Respiratory: Negative.   Cardiovascular: Negative.     Social History  Substance Use Topics  . Smoking status: Never Smoker  . Smokeless tobacco: Never Used  . Alcohol use Yes     Comment: very rare   Objective:   There were no vitals taken for this visit.  Physical Exam      Assessment & Plan:       Follow up: No Follow-up on file.

## 2017-06-09 ENCOUNTER — Ambulatory Visit (INDEPENDENT_AMBULATORY_CARE_PROVIDER_SITE_OTHER): Payer: BC Managed Care – PPO | Admitting: Family Medicine

## 2017-06-09 ENCOUNTER — Ambulatory Visit
Admission: RE | Admit: 2017-06-09 | Discharge: 2017-06-09 | Disposition: A | Discharge status: Home / Self Care | Payer: BC Managed Care – PPO | Source: Ambulatory Visit | Attending: Family Medicine | Admitting: Family Medicine

## 2017-06-09 ENCOUNTER — Encounter: Payer: Self-pay | Admitting: Family Medicine

## 2017-06-09 VITALS — BP 110/64 | HR 65 | Temp 98.5°F | Wt 190.6 lb

## 2017-06-09 DIAGNOSIS — G479 Sleep disorder, unspecified: Secondary | ICD-10-CM

## 2017-06-09 DIAGNOSIS — M25572 Pain in left ankle and joints of left foot: Secondary | ICD-10-CM | POA: Diagnosis not present

## 2017-06-09 DIAGNOSIS — F4323 Adjustment disorder with mixed anxiety and depressed mood: Secondary | ICD-10-CM | POA: Diagnosis not present

## 2017-06-09 MED ORDER — HYDROXYZINE HCL 25 MG PO TABS: ORAL_TABLET | ORAL | 3 refills | Status: DC

## 2017-06-09 MED ORDER — ESCITALOPRAM OXALATE 20 MG PO TABS: ORAL_TABLET | ORAL | 6 refills | Status: DC

## 2017-06-09 NOTE — Patient Instructions (Signed)
Ankle Sprain An ankle sprain is a stretch or tear in one of the tough, fiber-like tissues (ligaments) in the ankle. The ligaments in your ankle help to hold the bones of the ankle together. What are the causes? This condition is often caused by stepping on or falling on the outer edge of the foot. What increases the risk? This condition is more likely to develop in people who play sports. What are the signs or symptoms? Symptoms of this condition include:  Pain in your ankle.  Swelling.  Bruising. Bruising may develop right after you sprain your ankle or 1-2 days later.  Trouble standing or walking, especially when you turn or change directions. How is this diagnosed? This condition is diagnosed with a physical exam. During the exam, your health care provider will press on certain parts of your foot and ankle and try to move them in certain ways. X-rays may be taken to see how severe the sprain is and to check for broken bones. How is this treated? This condition may be treated with:  A brace. This is used to keep the ankle from moving until it heals.  An elastic bandage. This is used to support the ankle.  Crutches.  Pain medicine.  Surgery. This may be needed if the sprain is severe.  Physical therapy. This may help to improve the range of motion in the ankle. Follow these instructions at home:  Rest your ankle.  Take over-the-counter and prescription medicines only as told by your health care provider.  For 2-3 days, keep your ankle raised (elevated) above the level of your heart as much as possible.  If directed, apply ice to the area:  Put ice in a plastic bag.  Place a towel between your skin and the bag.  Leave the ice on for 20 minutes, 2-3 times a day.  If you were given a brace:  Wear it as directed.  Remove it to shower or bathe.  Try not to move your ankle much, but wiggle your toes from time to time. This helps to prevent swelling.  If you were  given an elastic bandage (dressing):  Remove it to shower or bathe.  Try not to move your ankle much, but wiggle your toes from time to time. This helps to prevent swelling.  Adjust the dressing to make it more comfortable if it feels too tight.  Loosen the dressing if you have numbness or tingling in your foot, or if your foot becomes cold and blue.  If you have crutches, use them as told by your health care provider. Continue to use them until you can walk without feeling pain in your ankle. Contact a health care provider if:  You have rapidly increasing bruising or swelling.  Your pain is not relieved with medicine. Get help right away if:  Your toes or foot becomes numb or blue.  You have severe pain that gets worse. This information is not intended to replace advice given to you by your health care provider. Make sure you discuss any questions you have with your health care provider. Document Released: 12/01/2005 Document Revised: 04/09/2016 Document Reviewed: 07/03/2015 Elsevier Interactive Patient Education  2017 [employer]  

## 2017-06-09 NOTE — Progress Notes (Signed)
Patient: Norma Harris Female    DOB: 03-26-1981   36 y.o.   MRN: [id number] Visit Date: 06/09/2017  Today's Provider: Vernie Murders, PA   Chief Complaint  Patient presents with  . Anxiety  . Follow-up   Subjective:    HPI Anxiety: Patient is here for a 3 month follow up. Last OV was 02/24/2017. Patient was advised to continue Lexapro 20 mg and Hydroxyzine. Symptoms were stable at that time. On 05/13/17 patient contacted office requesting a RX for increased panic and anxiety attacks due to father passing. RX Alprazolam 0.25 mg called in at pharmacy. Patient reports she was not notified RX was there so she has not started medication.  Patient reports symptoms are worse at this time. Current symptoms include difficulty sleeping, vivid dreams, emotions up and down during the day, lots of feelings and withdrawing from social life.   Patient Active Problem List   Diagnosis Date Noted  . Depression with anxiety 02/24/2017  . Clinical depression 06/07/2015  . Endometriosis 06/07/2015  . Fatigue 06/07/2015  . BP (high blood pressure) 06/07/2015  . Anxiety 11/20/2014  . 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 Cardiac murmur 06/14/2012   Past Surgical History:  Procedure Laterality Date  . HERNIA REPAIR    . LAPAROSCOPIC OVARIAN    . MENISCUS REPAIR     and ACL repai-has screws/staples-RIGHT  . PILONIDAL CYST EXCISION    . VEIN LIGATION AND STRIPPING     x 7   Family History  Problem Relation Age of Onset  . Fibromyalgia Mother   . Hyperlipidemia Father   . Alzheimer's disease Maternal Grandmother   . Lung cancer Paternal Grandfather   . Diabetes Maternal Grandfather   . Cancer Maternal Grandfather    Allergies  Allergen Reactions  . Arnica Rash    Hives, blisters  . Duloxetine Hcl  Other (See Comments)    Suicidal feelings  . Pseudoephedrine-Guaifenesin     Other reaction(s): Irregular Heart Rate     Previous Medications   ALPRAZOLAM (XANAX) 0.25 MG TABLET    Take 1 tablet (0.25 mg total) by mouth 2 (two) times daily as needed for anxiety.   CETIRIZINE (ZYRTEC) 10 MG TABLET    Take 10 mg by mouth daily.   ESCITALOPRAM (LEXAPRO) 20 MG TABLET    TAKE 1 TABLET(20 MG) BY MOUTH DAILY   HYDROXYZINE (ATARAX/VISTARIL) 25 MG TABLET    TAKE 1 TABLET(25 MG) BY MOUTH AT BEDTIME AS NEEDED   IBUPROFEN (ADVIL,MOTRIN) 800 MG TABLET    TK 1 T PO TID FOR 7 DAYS   METOPROLOL SUCCINATE (TOPROL-XL) 50 MG 24 HR TABLET    TAKE 1/2 TO 1 TABLET BY MOUTH EVERY DAY   SIROLIMUS (RAPAMUNE) 1 MG TABLET        Review of Systems  Constitutional: Negative.   Respiratory: Negative.   Cardiovascular: Negative.   Psychiatric/Behavioral: Positive for sleep disturbance. The patient is nervous/anxious.     Social History  Substance Use Topics  . Smoking status: Never Smoker  . Smokeless tobacco: Never Used  . Alcohol use Yes     Comment: very rare   Objective:   BP 110/64 (BP Location: Right Arm, Patient Position: Sitting, Cuff Size: Normal)   Pulse 65   Temp 98.5 F (36.9 C) (Oral)   Wt 190 lb 9.6 oz (  86.5 kg)   SpO2 99%   BMI 31.72 kg/m   Physical Exam  Constitutional: She is oriented to person, place, and time. She appears well-developed and well-nourished. No distress.  HENT:  Head: Normocephalic and atraumatic.  Right Ear: Hearing normal.  Left Ear: Hearing normal.  Nose: Nose normal.  Eyes: Conjunctivae and lids are normal. Right eye exhibits no discharge. Left eye exhibits no discharge. No scleral icterus.  Neck: Neck supple.  Cardiovascular: Normal rate and regular rhythm.   Pulmonary/Chest: Effort normal and breath sounds normal. No respiratory distress.  Abdominal: Soft. Bowel sounds are normal.  Musculoskeletal: She exhibits tenderness.  Tender along the lateral  malleolus and anterior ankle with swelling. No bruising noted.  Neurological: She is alert and oriented to person, place, and time.  Skin: Skin is intact. No lesion and no rash noted.  Psychiatric: She has a normal mood and affect. Her speech is normal and behavior is normal. Thought content normal.      Assessment & Plan:     1. Adjustment reaction with anxiety and depression Tolerating Lexapro 20 mg qd for depression and anxiety. Father died in his sleep from a heart attack on 05-12-17. Rx for Xanax was sent to her pharmacy for bereavement and anxiety flares. Will consider going to the [facility] for bereavement counseling. May use the Xanax 0.25 mg BID prn panic/nervousness. Recheck in 3 months as needed. - escitalopram (LEXAPRO) 20 MG tablet; TAKE 1 TABLET(20 MG) BY MOUTH DAILY  Dispense: 30 tablet; Refill: 6  2. Sleep disturbance Associated with vivid dreams and anxiety/depression. Sleeps  From 10:00pm to 6:00am. Feels the Hydroxyzine helps with sleep and not too drowsy during the day. Recheck 3 months prn. - hydrOXYzine (ATARAX/VISTARIL) 25 MG tablet; TAKE 1 TABLET(25 MG) BY MOUTH AT BEDTIME AS NEEDED  Dispense: 30 tablet; Refill: 3  3. Acute left ankle pain Fell on some brick steps at 8:00am today. Swelling around the lateral malleolus. Landed on top of the ankle. Pain to palpate or flex ankle laterally or dorsiflexion. - DG Ankle Complete Left

## 2017-06-10 NOTE — Progress Notes (Signed)
Advised  ED 

## 2017-07-23 ENCOUNTER — Encounter: Payer: Self-pay | Admitting: Family Medicine

## 2017-07-23 ENCOUNTER — Ambulatory Visit (INDEPENDENT_AMBULATORY_CARE_PROVIDER_SITE_OTHER): Payer: BC Managed Care – PPO | Admitting: Family Medicine

## 2017-07-23 VITALS — BP 128/68 | Temp 98.8°F | Resp 16 | Wt 192.0 lb

## 2017-07-23 DIAGNOSIS — F418 Other specified anxiety disorders: Secondary | ICD-10-CM

## 2017-07-23 DIAGNOSIS — R41840 Attention and concentration deficit: Secondary | ICD-10-CM

## 2017-07-23 MED ORDER — ATOMOXETINE HCL 40 MG PO CAPS: ORAL_CAPSULE | ORAL | 0 refills | Status: DC

## 2017-07-23 NOTE — Progress Notes (Signed)
Patient: Norma Harris Female    DOB: 09-19-1981   36 y.o.   MRN: [id number] Visit Date: 07/23/2017  Today's Provider: Vernie Murders, PA   Chief Complaint  Patient presents with  . Depression  . Anxiety   Subjective:    HPI Depression, follow up: Patient was last seen in the office 6 weeks ago. No changes were made in her medications. She reports that she is tolerating Lexapro well with good symptom control.   Insomnia, follow up: Patient was last seen in the office 6 weeks ago. No changes were made in her medications. She is currently taking hydroxyzine 25mg  and reports that it helps with her sleep.      Allergies  Allergen Reactions  . Arnica Rash    Hives, blisters  . Duloxetine Hcl Other (See Comments)    Suicidal feelings  . Pseudoephedrine-Guaifenesin     Other reaction(s): Irregular Heart Rate     Current Outpatient Prescriptions:  .  ALPRAZolam (XANAX) 0.25 MG tablet, Take 1 tablet (0.25 mg total) by mouth 2 (two) times daily as needed for anxiety. (Patient not taking: Reported on 06/09/2017), Disp: 30 tablet, Rfl: 0 .  cetirizine (ZYRTEC) 10 MG tablet, Take 10 mg by mouth daily., Disp: , Rfl:  .  escitalopram (LEXAPRO) 20 MG tablet, TAKE 1 TABLET(20 MG) BY MOUTH DAILY, Disp: 30 tablet, Rfl: 6 .  hydrOXYzine (ATARAX/VISTARIL) 25 MG tablet, TAKE 1 TABLET(25 MG) BY MOUTH AT BEDTIME AS NEEDED, Disp: 30 tablet, Rfl: 3 .  ibuprofen (ADVIL,MOTRIN) 800 MG tablet, TK 1 T PO TID FOR 7 DAYS, Disp: , Rfl: 0 .  metoprolol succinate (TOPROL-XL) 50 MG 24 hr tablet, TAKE 1/2 TO 1 TABLET BY MOUTH EVERY DAY, Disp: 30 tablet, Rfl: 6 .  sirolimus (RAPAMUNE) 1 MG tablet, , Disp: , Rfl:   Review of Systems  Constitutional: Negative.   Cardiovascular: Negative.   Musculoskeletal: Negative.   Neurological: Negative.   Psychiatric/Behavioral: Negative.     Social History  Substance Use Topics  . Smoking status: Never Smoker  . Smokeless tobacco: Never Used  . Alcohol use  Yes     Comment: very rare   Objective:   BP 128/68 (BP Location: Right Arm, Patient Position: Sitting, Cuff Size: Normal)   Temp 98.8 F (37.1 C)   Resp 16   Wt 192 lb (87.1 kg)   BMI 31.95 kg/m  Vitals:   07/23/17 1144  BP: 128/68  Resp: 16  Temp: 98.8 F (37.1 C)  Weight: 192 lb (87.1 kg)     Physical Exam  Constitutional: She is oriented to person, place, and time. She appears well-developed and well-nourished. No distress.  HENT:  Head: Normocephalic and atraumatic.  Right Ear: Hearing normal.  Left Ear: Hearing normal.  Nose: Nose normal.  Eyes: Conjunctivae and lids are normal. Right eye exhibits no discharge. Left eye exhibits no discharge. No scleral icterus.  Neck: Neck supple.  Cardiovascular: Normal rate and regular rhythm.   Pulmonary/Chest: Effort normal and breath sounds normal. No respiratory distress.  Abdominal: Bowel sounds are normal.  Musculoskeletal: Normal range of motion.  Neurological: She is alert and oriented to person, place, and time.  Skin: Skin is intact. No lesion and no rash noted.  Psychiatric: She has a normal mood and affect. Her speech is normal and behavior is normal. Thought content normal.      Assessment & Plan:     1. Depression with anxiety Sleeping fairly  well with some vivid dreams when she uses the Atarax with the Lexapro. 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 No suicidal ideation or panic attacks recently.   2. 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 Will stop the Lexapro and start Strattera to use with Hydroxyzine prn. Call report of response in 3-4 weeks to plan recheck appointment.       Vernie Murders, PA  [facility]

## 2017-10-12 ENCOUNTER — Other Ambulatory Visit: Payer: Self-pay | Admitting: Family Medicine

## 2017-10-12 DIAGNOSIS — G479 Sleep disorder, unspecified: Secondary | ICD-10-CM

## 2017-11-10 ENCOUNTER — Other Ambulatory Visit: Payer: Self-pay | Admitting: Family Medicine

## 2017-11-10 DIAGNOSIS — G479 Sleep disorder, unspecified: Secondary | ICD-10-CM

## 2018-01-16 ENCOUNTER — Other Ambulatory Visit: Payer: Self-pay | Admitting: Family Medicine

## 2018-01-16 DIAGNOSIS — I10 Essential (primary) hypertension: Secondary | ICD-10-CM

## 2018-01-16 DIAGNOSIS — F4323 Adjustment disorder with mixed anxiety and depressed mood: Secondary | ICD-10-CM

## 2018-01-18 ENCOUNTER — Ambulatory Visit: Payer: BC Managed Care – PPO | Admitting: Family Medicine

## 2018-01-18 ENCOUNTER — Encounter: Payer: Self-pay | Admitting: Family Medicine

## 2018-01-18 VITALS — BP 128/84 | HR 63 | Temp 98.6°F | Resp 15 | Wt 204.2 lb

## 2018-01-18 DIAGNOSIS — J40 Bronchitis, not specified as acute or chronic: Secondary | ICD-10-CM | POA: Diagnosis not present

## 2018-01-18 MED ORDER — DOXYCYCLINE HYCLATE 100 MG PO TABS: 100.0000 mg | ORAL_TABLET | Freq: Two times a day (BID) | ORAL | 0 refills | Status: DC

## 2018-01-18 MED ORDER — ALBUTEROL SULFATE HFA 108 (90 BASE) MCG/ACT IN AERS: 2.0000 | INHALATION_SPRAY | Freq: Four times a day (QID) | RESPIRATORY_TRACT | 1 refills | Status: DC | PRN

## 2018-01-18 NOTE — Progress Notes (Signed)
Subjective:     Patient ID: Norma Harris, female   DOB: 1981/08/18, 37 y.o.   MRN: [id number] Chief Complaint  Patient presents with  . URI    Patient comes into office today with reports of cold like symptoms for two weeks. Patient reports cough, runny nose, congestion and right ear pain. Patient states that she had a sore throat at onset of symptoms but has since improved. Patient has tried otc Xycam for relief.    HPI States sinus drainage is clear with PND and accompanying hacky cough. Reports she also will cough after exercising esp. Dancing when she is not ill.  Review of Systems     Objective:   Physical Exam  Constitutional: She appears well-developed and well-nourished. No distress.  Ears: T.M's intact without inflammation Throat: no tonsillar enlargement or exudate Neck: no cervical adenopathy Lungs: clear     Assessment:    1. Bronchitis - doxycycline (VIBRA-TABS) 100 MG tablet; Take 1 tablet (100 mg total) by mouth 2 (two) times daily.  Dispense: 14 tablet; Refill: 0 - albuterol (PROVENTIL HFA;VENTOLIN HFA) 108 (90 Base) MCG/ACT inhaler; Inhale 2 puffs into the lungs every 6 (six) hours as needed for wheezing or shortness of breath. May also take 2 puffs prior to exercise.  Dispense: 1 Inhaler; Refill: 1    Plan:   Try albuterol (demonstration provided), Delsym, and Benadryl at night first. If not improving over the next few days to start the abx.

## 2018-01-18 NOTE — Telephone Encounter (Signed)
Pharmacy requesting refills. Thanks!  

## 2018-01-18 NOTE — Patient Instructions (Addendum)
Discussed use of Delsym for cough, albuterol inhaler and Benadryl at night for post nasal drainage. If not improving start the antibiotic.

## 2018-01-19 NOTE — Telephone Encounter (Signed)
Changed Lexapro to the Strattera in August 2018. Ask patient if she is still taking the Lexapro or the Strattera. Refilled Metoprolol.

## 2018-01-20 ENCOUNTER — Other Ambulatory Visit: Payer: Self-pay | Admitting: Family Medicine

## 2018-01-20 DIAGNOSIS — F4323 Adjustment disorder with mixed anxiety and depressed mood: Secondary | ICD-10-CM

## 2018-03-25 ENCOUNTER — Other Ambulatory Visit: Payer: Self-pay | Admitting: Family Medicine

## 2018-03-25 DIAGNOSIS — G479 Sleep disorder, unspecified: Secondary | ICD-10-CM

## 2018-04-02 ENCOUNTER — Encounter: Payer: Self-pay | Admitting: Family Medicine

## 2018-04-02 ENCOUNTER — Ambulatory Visit: Payer: BC Managed Care – PPO | Admitting: Family Medicine

## 2018-04-02 VITALS — BP 122/64 | HR 68 | Temp 98.8°F | Resp 16 | Wt 201.0 lb

## 2018-04-02 DIAGNOSIS — E669 Obesity, unspecified: Secondary | ICD-10-CM | POA: Diagnosis not present

## 2018-04-02 DIAGNOSIS — R635 Abnormal weight gain: Secondary | ICD-10-CM

## 2018-04-02 DIAGNOSIS — R5383 Other fatigue: Secondary | ICD-10-CM

## 2018-04-02 NOTE — Progress Notes (Signed)
Patient: Norma Harris Female    DOB: 12/09/1981   37 y.o.   MRN: [id number] Visit Date: 04/02/2018  Today's Provider: Vernie Murders, PA   Chief Complaint  Patient presents with  . Fatigue   Subjective:    HPI Pt is here today to discuss her thyroid. She has been having a lot of fatigue she has a hard time getting up in the mornings and feels like she could sleep all the time. She also has been gaining weight despite trying to lose weight. Pt feels well emotionally.     Past Medical History:  Diagnosis Date  . Hypertension    Patient Active Problem List   Diagnosis Date Noted  . Depression with anxiety 02/24/2017  . Clinical depression 06/07/2015  . Endometriosis 06/07/2015  . Fatigue 06/07/2015  . BP (high blood pressure) 06/07/2015  . Anxiety 11/20/2014  . 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 Cardiac murmur 06/14/2012   Past Surgical History:  Procedure Laterality Date  . HERNIA REPAIR    . LAPAROSCOPIC OVARIAN    . MENISCUS REPAIR     and ACL repai-has screws/staples-RIGHT  . PILONIDAL CYST EXCISION    . VEIN LIGATION AND STRIPPING     x 7   Family History  Problem Relation Age of Onset  . Fibromyalgia Mother   . Hyperlipidemia Father   . Alzheimer's disease Maternal Grandmother   . Lung cancer Paternal Grandfather   . Diabetes Maternal Grandfather   . Cancer Maternal Grandfather    Allergies  Allergen Reactions  . Arnica Rash    Hives, blisters  . Duloxetine Hcl Other (See Comments)    Suicidal feelings  . Pseudoephedrine-Guaifenesin     Other reaction(s): Irregular Heart Rate    Current Outpatient Medications:  .  albuterol (PROVENTIL HFA;VENTOLIN HFA) 108 (90 Base) MCG/ACT inhaler, Inhale 2 puffs into the lungs every 6 (six) hours as needed for  wheezing or shortness of breath. May also take 2 puffs prior to exercise., Disp: 1 Inhaler, Rfl: 1 .  cetirizine (ZYRTEC) 10 MG tablet, Take 10 mg by mouth daily., Disp: , Rfl:  .  escitalopram (LEXAPRO) 10 MG tablet, TAKE 1 TABLET(10 MG) BY MOUTH DAILY, Disp: 30 tablet, Rfl: 6 .  hydrOXYzine (ATARAX/VISTARIL) 25 MG tablet, TAKE 1 TABLET(25 MG) BY MOUTH AT BEDTIME AS NEEDED, Disp: 30 tablet, Rfl: 1 .  ibuprofen (ADVIL,MOTRIN) 800 MG tablet, TK 1 T PO TID FOR 7 DAYS, Disp: , Rfl: 0 .  levonorgestrel (MIRENA) 20 MCG/24HR IUD, by Intrauterine route., Disp: , Rfl:  .  metoprolol succinate (TOPROL-XL) 50 MG 24 hr tablet, TAKE 1/2 TO 1 TABLET BY MOUTH EVERY DAY, Disp: 30 tablet, Rfl: 3  Review of Systems  Constitutional: Positive for fatigue.  HENT: Negative.   Eyes: Negative.   Respiratory: Negative.   Cardiovascular: Negative.   Gastrointestinal: Negative.   Endocrine: Negative.   Genitourinary: Negative.   Musculoskeletal: Negative.   Skin: Negative.   Allergic/Immunologic: Negative.   Neurological: Negative.   Hematological: Negative.   Psychiatric/Behavioral: Negative.    Social History   Tobacco Use  . Smoking status: Never Smoker  . Smokeless tobacco: Never Used  Substance Use Topics  . Alcohol use: Yes    Comment: very rare   Objective:   BP 122/64 (BP Location: Right Arm,  Patient Position: Sitting, Cuff Size: Large)   Pulse 68   Temp 98.8 F (37.1 C) (Oral)   Resp 16   Wt 201 lb (91.2 kg)   BMI 33.45 kg/m  Vitals:   04/02/18 1109  BP: 122/64  Pulse: 68  Resp: 16  Temp: 98.8 F (37.1 C)  TempSrc: Oral  Weight: 201 lb (91.2 kg)   Wt Readings from Last 3 Encounters:  04/02/18 201 lb (91.2 kg)  01/18/18 204 lb 3.2 oz (92.6 kg)  07/23/17 192 lb (87.1 kg)    Physical Exam  Constitutional: She is oriented to person, place, and time. She appears well-developed and well-nourished. No distress.  HENT:  Head: Normocephalic and atraumatic.  Right Ear: Hearing  normal.  Left Ear: Hearing normal.  Nose: Nose normal.  Eyes: Conjunctivae and lids are normal. Right eye exhibits no discharge. Left eye exhibits no discharge. No scleral icterus.  Neck: Normal range of motion. Neck supple. No thyromegaly present.  Pulmonary/Chest: Effort normal. No respiratory distress.  Abdominal: Soft. Bowel sounds are normal.  Musculoskeletal: Normal range of motion.  Lymphadenopathy:    She has no cervical adenopathy.  Neurological: She is alert and oriented to person, place, and time. She displays normal reflexes.  Skin: Skin is intact. No lesion and no rash noted.  Psychiatric: She has a normal mood and affect. Her speech is normal and behavior is normal. Thought content normal.      Assessment & Plan:     1. Fatigue, unspecified type Onset over the past couple months with inability to lose significant weight with diet and exercise program. Concerned fatigue may be an anemia, B12 deficiency, thyroid disease, diabetes, or magnesium deficiency. Will check blood tests and encouraged to continue diet control and regular exercise. Follow up pending lab reports. Denies change in depression with anxiety disorder. Feels it is well controlled with the Lexapro and Atarax regimen. No menstrual period since getting the Mirena IUD 5 years ago (for endometriosis). - CBC with Differential/Platelet - Comprehensive metabolic panel - Hemoglobin A1c - T4 - TSH - Methylmalonic Acid - Magnesium  2. Weight gain Unexplained weight gain and difficulty reducing weight. - CBC with Differential/Platelet - Comprehensive metabolic panel - Hemoglobin A1c - T4 - TSH  3. Obesity, Class I, BMI 30-34.9 Has been working on a regular exercise program and calorie reduction diet. Has lost 3 lbs since February 2019. Will check routine labs to evaluate for metabolic changes, diabetes or thyroid disorder. - CBC with Differential/Platelet - Comprehensive metabolic panel - Hemoglobin A1c -  T4 - TSH       Vernie Murders, PA  [facility]

## 2018-04-07 LAB — COMPREHENSIVE METABOLIC PANEL
A/G RATIO: 1.6 (ref 1.2–2.2)
ALT: 15 IU/L (ref 0–32)
AST: 13 IU/L (ref 0–40)
Albumin: 4.4 g/dL (ref 3.5–5.5)
Alkaline Phosphatase: 71 IU/L (ref 39–117)
BILIRUBIN TOTAL: 0.3 mg/dL (ref 0.0–1.2)
BUN/Creatinine Ratio: 13 (ref 9–23)
BUN: 10 mg/dL (ref 6–20)
CALCIUM: 9.6 mg/dL (ref 8.7–10.2)
CHLORIDE: 104 mmol/L (ref 96–106)
CO2: 21 mmol/L (ref 20–29)
Creatinine, Ser: 0.79 mg/dL (ref 0.57–1.00)
GFR, EST AFRICAN AMERICAN: 111 mL/min/{1.73_m2} (ref >59)
GFR, EST NON AFRICAN AMERICAN: 97 mL/min/{1.73_m2} (ref >59)
GLOBULIN, TOTAL: 2.7 g/dL (ref 1.5–4.5)
Glucose: 89 mg/dL (ref 65–99)
POTASSIUM: 4.4 mmol/L (ref 3.5–5.2)
SODIUM: 139 mmol/L (ref 134–144)
Total Protein: 7.1 g/dL (ref 6.0–8.5)

## 2018-04-07 LAB — CBC WITH DIFFERENTIAL/PLATELET
BASOS: 0 %
Basophils Absolute: 0 10*3/uL (ref 0.0–0.2)
EOS (ABSOLUTE): 0.1 10*3/uL (ref 0.0–0.4)
EOS: 1 %
HEMATOCRIT: 37.7 % (ref 34.0–46.6)
Hemoglobin: 12.7 g/dL (ref 11.1–15.9)
IMMATURE GRANULOCYTES: 0 %
Immature Grans (Abs): 0 10*3/uL (ref 0.0–0.1)
LYMPHS ABS: 2.2 10*3/uL (ref 0.7–3.1)
Lymphs: 30 %
MCH: 29.3 pg (ref 26.6–33.0)
MCHC: 33.7 g/dL (ref 31.5–35.7)
MCV: 87 fL (ref 79–97)
MONOS ABS: 0.6 10*3/uL (ref 0.1–0.9)
Monocytes: 8 %
NEUTROS ABS: 4.4 10*3/uL (ref 1.4–7.0)
Neutrophils: 61 %
PLATELETS: 325 10*3/uL (ref 150–379)
RBC: 4.34 x10E6/uL (ref 3.77–5.28)
RDW: 13.6 % (ref 12.3–15.4)
WBC: 7.3 10*3/uL (ref 3.4–10.8)

## 2018-04-07 LAB — METHYLMALONIC ACID, SERUM: Methylmalonic Acid: 154 nmol/L (ref 0–378)

## 2018-04-07 LAB — T4: T4, Total: 5.6 ug/dL (ref 4.5–12.0)

## 2018-04-07 LAB — MAGNESIUM: MAGNESIUM: 1.9 mg/dL (ref 1.6–2.3)

## 2018-04-07 LAB — HEMOGLOBIN A1C
ESTIMATED AVERAGE GLUCOSE: 114 mg/dL
Hgb A1c MFr Bld: 5.6 % (ref 4.8–5.6)

## 2018-04-07 LAB — TSH: TSH: 1.11 u[IU]/mL (ref 0.450–4.500)

## 2018-05-21 ENCOUNTER — Other Ambulatory Visit: Payer: Self-pay | Admitting: Family Medicine

## 2018-05-21 DIAGNOSIS — G479 Sleep disorder, unspecified: Secondary | ICD-10-CM

## 2018-06-24 ENCOUNTER — Other Ambulatory Visit: Payer: Self-pay | Admitting: Obstetrics & Gynecology

## 2018-06-24 DIAGNOSIS — R102 Pelvic and perineal pain: Secondary | ICD-10-CM

## 2018-06-28 ENCOUNTER — Other Ambulatory Visit: Payer: BC Managed Care – PPO

## 2018-06-29 ENCOUNTER — Ambulatory Visit: Payer: BC Managed Care – PPO | Admitting: Family Medicine

## 2018-07-02 ENCOUNTER — Encounter: Payer: Self-pay | Admitting: Family Medicine

## 2018-07-16 ENCOUNTER — Telehealth: Payer: Self-pay | Admitting: Family Medicine

## 2018-07-16 NOTE — Telephone Encounter (Signed)
done

## 2018-07-16 NOTE — Telephone Encounter (Signed)
Pt states she is calling to check the status of a form that needs to be completed.

## 2018-07-19 ENCOUNTER — Encounter: Payer: Self-pay | Admitting: Radiology

## 2018-07-19 ENCOUNTER — Ambulatory Visit
Admission: RE | Admit: 2018-07-19 | Discharge: 2018-07-19 | Disposition: A | Discharge status: Home / Self Care | Payer: BC Managed Care – PPO | Source: Ambulatory Visit | Attending: Obstetrics & Gynecology | Admitting: Obstetrics & Gynecology

## 2018-07-19 DIAGNOSIS — R102 Pelvic and perineal pain: Secondary | ICD-10-CM

## 2018-07-19 MED ORDER — IOPAMIDOL (ISOVUE-300) INJECTION 61%
100.0000 mL | Freq: Once | INTRAVENOUS | Status: AC | PRN
  Administered 2018-07-19: 100 mL via INTRAVENOUS

## 2018-08-20 ENCOUNTER — Other Ambulatory Visit: Payer: Self-pay | Admitting: Family Medicine

## 2018-08-20 DIAGNOSIS — F4323 Adjustment disorder with mixed anxiety and depressed mood: Secondary | ICD-10-CM

## 2018-09-17 ENCOUNTER — Encounter: Payer: Self-pay | Admitting: Family Medicine

## 2018-09-17 ENCOUNTER — Ambulatory Visit: Payer: BC Managed Care – PPO | Admitting: Family Medicine

## 2018-09-17 VITALS — BP 126/84 | HR 66 | Temp 98.4°F | Ht 64.5 in | Wt 208.0 lb

## 2018-09-17 DIAGNOSIS — F419 Anxiety disorder, unspecified: Secondary | ICD-10-CM | POA: Diagnosis not present

## 2018-09-17 DIAGNOSIS — Q872 Congenital malformation syndromes predominantly involving limbs: Secondary | ICD-10-CM | POA: Diagnosis not present

## 2018-09-17 DIAGNOSIS — I10 Essential (primary) hypertension: Secondary | ICD-10-CM

## 2018-09-17 NOTE — Progress Notes (Signed)
Patient: Norma Harris Female    DOB: [birth year]   37 y.o.   MRN: [id number] Visit Date: 09/17/2018  Today's Provider: Vernie Murders, PA   Chief Complaint  Patient presents with  . Form Completion  . Neck Pain    Started Wednesday   Subjective:    Neck Pain   This is a new problem. The current episode started in the past 7 days. The problem has been waxing and waning. The pain is associated with nothing. The pain is present in the left side. Pertinent negatives include no chest pain, fever, headaches, leg pain, numbness, pain with swallowing, paresis, photophobia, syncope, tingling, trouble swallowing, visual change, weakness or weight loss.   Past Medical History:  Diagnosis Date  . Hypertension    Past Surgical History:  Procedure Laterality Date  . HERNIA REPAIR    . LAPAROSCOPIC OVARIAN    . MENISCUS REPAIR     and ACL repai-has screws/staples-RIGHT  . PILONIDAL CYST EXCISION    . VEIN LIGATION AND STRIPPING     x 7   Family History  Problem Relation Age of Onset  . Fibromyalgia Mother   . Hyperlipidemia Father   . Alzheimer's disease Maternal Grandmother   . Lung cancer Paternal Grandfather   . Diabetes Maternal Grandfather   . Cancer Maternal Grandfather    Patient Active Problem List   Diagnosis Date Noted  . Depression with anxiety 02/24/2017  . Clinical depression 06/07/2015  . Endometriosis 06/07/2015  . Fatigue 06/07/2015  . BP (high blood pressure) 06/07/2015  . Anxiety 11/20/2014  . 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 Cardiac murmur 06/14/2012   Allergies  Allergen Reactions  . Arnica Rash    Hives, blisters  . Duloxetine Hcl Other (See Comments)    Suicidal feelings  . Pseudoephedrine-Guaifenesin     Other reaction(s): Irregular Heart Rate     Current Outpatient Medications:  .  albuterol (PROVENTIL HFA;VENTOLIN HFA) 108 (90 Base) MCG/ACT inhaler, Inhale 2 puffs into the lungs every 6 (six) hours as needed for wheezing or shortness of breath. May also take 2 puffs prior to exercise., Disp: 1 Inhaler, Rfl: 1 .  cetirizine (ZYRTEC) 10 MG tablet, Take 10 mg by mouth daily., Disp: , Rfl:  .  escitalopram (LEXAPRO) 10 MG tablet, TAKE 1 TABLET(10 MG) BY MOUTH DAILY, Disp: 30 tablet, Rfl: 2 .  hydrOXYzine (ATARAX/VISTARIL) 25 MG tablet, Take 1 tablet (25 mg total) by mouth at bedtime as needed., Disp: 30 tablet, Rfl: 3 .  ibuprofen (ADVIL,MOTRIN) 800 MG tablet, TK 1 T PO TID FOR 7 DAYS, Disp: , Rfl: 0 .  levonorgestrel (MIRENA) 20 MCG/24HR IUD, by Intrauterine route., Disp: , Rfl:  .  metoprolol succinate (TOPROL-XL) 50 MG 24 hr tablet, TAKE 1/2 TO 1 TABLET BY MOUTH EVERY DAY, Disp: 30 tablet, Rfl: 3  Review of Systems  Constitutional: Positive for fatigue. Negative for activity change, appetite change, chills, diaphoresis, fever, unexpected weight change and weight loss.  HENT: Negative for trouble swallowing.   Eyes: Negative for photophobia.  Cardiovascular: Negative for chest pain and syncope.  Musculoskeletal: Positive for neck pain. Negative for arthralgias, back pain, gait problem, joint swelling, myalgias and neck stiffness.  Neurological: Negative for dizziness, tingling, weakness, light-headedness, numbness and headaches.   Social History   Tobacco Use  .  Smoking status: Never Smoker  . Smokeless tobacco: Never Used  Substance Use Topics  . Alcohol use: Yes    Comment: very rare   Objective:   BP 126/84 (BP Location: Right Arm, Patient Position: Sitting, Cuff Size: Large)   Pulse 66   Temp 98.4 F (36.9 C) (Oral)   Ht 5' 4.5" (1.638 m)   Wt 208 lb (94.3 kg)   SpO2 99%   BMI 35.15 kg/m  Vitals:   09/17/18 1600  BP: 126/84  Pulse: 66  Temp: 98.4 F (36.9 C)  TempSrc: Oral  SpO2: 99%  Weight: 208 lb (94.3  kg)  Height: 5' 4.5" (1.638 m)   Physical Exam  Constitutional: She is oriented to person, place, and time. She appears well-developed and well-nourished. No distress.  HENT:  Head: Normocephalic and atraumatic.  Right Ear: Hearing normal.  Left Ear: Hearing normal.  Nose: Nose normal.  Eyes: Conjunctivae and lids are normal. Right eye exhibits no discharge. Left eye exhibits no discharge. No scleral icterus.  Neck: Normal range of motion. Neck supple.  Cardiovascular: Normal rate and regular rhythm.  Pulmonary/Chest: Effort normal and breath sounds normal. No respiratory distress.  Abdominal: Soft. Bowel sounds are normal.  Musculoskeletal: Normal range of motion.  Some swelling of the right leg with varicose veins secondary to Klippel Trenaunay Syndrome.  Neurological: She is alert and oriented to person, place, and time.  Skin: Skin is intact. No lesion and no rash noted.  Psychiatric: She has a normal mood and affect. Her speech is normal and behavior is normal. Thought content normal.      Assessment & Plan:     1. Essential hypertension BP well controlled. Tolerating Metoprolol Succinate 50 mg 1/2-1 tablet daily. No chest pains, palpitations or dyspnea. Allergic rhinitis rarely occurs and responds well to Flonase and Zyrtec prn. Has not had to use the Albuterol in the past year. Recheck in the Spring of 2020 for follow up and repeat labs (has frequent labs at the [facility] - but not present for review at this time). 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  2. Anxiety Well controlled without panic, agoraphobia or depressive suicidal ideation. Tolerating Lexapro 10 mg qd and Hydroxyzine 25 mg at bedtime prn. Continue this regimen and recheck in the Spring of 2020.  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 Followed at [facility]  regularly for more procedures.       Vernie Murders, PA  [facility]

## 2018-09-21 ENCOUNTER — Other Ambulatory Visit: Payer: Self-pay | Admitting: Family Medicine

## 2018-09-21 DIAGNOSIS — G479 Sleep disorder, unspecified: Secondary | ICD-10-CM

## 2018-10-07 ENCOUNTER — Ambulatory Visit
Admission: EM | Admit: 2018-10-07 | Discharge: 2018-10-07 | Disposition: A | Discharge status: Home / Self Care | Payer: BC Managed Care – PPO | Attending: Family Medicine | Admitting: Family Medicine

## 2018-10-07 ENCOUNTER — Encounter: Payer: Self-pay | Admitting: Emergency Medicine

## 2018-10-07 ENCOUNTER — Other Ambulatory Visit: Payer: Self-pay

## 2018-10-07 DIAGNOSIS — R1032 Left lower quadrant pain: Secondary | ICD-10-CM

## 2018-10-07 MED ORDER — MELOXICAM 15 MG PO TABS: 15.0000 mg | ORAL_TABLET | Freq: Every day | ORAL | 0 refills | Status: DC | PRN

## 2018-10-07 MED ORDER — AMOXICILLIN-POT CLAVULANATE 875-125 MG PO TABS: 1.0000 | ORAL_TABLET | Freq: Two times a day (BID) | ORAL | 0 refills | Status: DC

## 2018-10-07 NOTE — ED Provider Notes (Signed)
[facility]    CSN: [id number] Arrival date & time: 10/07/18  1429  History   Chief Complaint Chief Complaint  Patient presents with  . Abdominal Pain   HPI  37 year old female presents with left lower abdominal pain.  Patient reports a 4-day history of left lower quadrant pain.  Patient states that it is sharp.  Moderate in severity.  Occurs when she walks or stands.  Improves when she is sitting down or lying down.  No fever.  She has had diarrhea today.  No reports of bloody stool.  Patient has had a left oophorectomy.  Patient also has a history of endometriosis.  Endometriosis controlled with IUD.  No medications or interventions tried.  No other reported symptoms.  No other complaints.  PMH, Surgical Hx, Family Hx, Social History reviewed and updated as below.  PMH: Patient Active Problem List   Diagnosis Date Noted  . Depression with anxiety 02/24/2017  . Clinical depression 06/07/2015  . Endometriosis 06/07/2015  . Fatigue 06/07/2015  . BP (high blood pressure) 06/07/2015  . Anxiety 11/20/2014  . 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 Cardiac murmur 06/14/2012    Past Surgical History:  Procedure Laterality Date  . HERNIA REPAIR    . LAPAROSCOPIC OVARIAN    . MENISCUS REPAIR     and ACL repai-has screws/staples-RIGHT  . PILONIDAL CYST EXCISION    . VEIN LIGATION AND STRIPPING     x 7    OB History    Gravida  0   Para  0   Term  0   Preterm  0   AB  0   Living  0     SAB  0   TAB  0   Ectopic  0   Multiple  0   Live Births              Home Medications    Prior to Admission medications   Medication Sig Start Date End Date Taking? Authorizing Provider  cetirizine (ZYRTEC) 10 MG tablet Take 10 mg by mouth daily.   Yes [provider]  escitalopram (LEXAPRO) 10 MG tablet TAKE 1 TABLET(10 MG) BY MOUTH DAILY 08/23/18  Yes Chrismon, Vickki Muff, PA  hydrOXYzine (ATARAX/VISTARIL) 25 MG tablet TAKE 1 TABLET(25 MG) BY MOUTH AT BEDTIME AS NEEDED 09/23/18  Yes Chrismon, Vickki Muff, PA  levonorgestrel (MIRENA) 20 MCG/24HR IUD by Intrauterine route.   Yes [provider]  metoprolol succinate (TOPROL-XL) 50 MG 24 hr tablet TAKE 1/2 TO 1 TABLET BY MOUTH EVERY DAY 01/19/18  Yes Chrismon, Vickki Muff, PA  albuterol (PROVENTIL HFA;VENTOLIN HFA) 108 (90 Base) MCG/ACT inhaler Inhale 2 puffs into the lungs every 6 (six) hours as needed for wheezing or shortness of breath. May also take 2 puffs prior to exercise. 01/18/18   Carmon Ginsberg, PA  amoxicillin-clavulanate (AUGMENTIN) 875-125 MG tablet Take 1 tablet by mouth every 12 (twelve) hours. 10/07/18   Coral Spikes, DO  meloxicam (MOBIC) 15 MG tablet Take 1 tablet (15 mg total) by mouth daily as needed. 10/07/18   Coral Spikes, DO    Family History Family History  Problem Relation Age of Onset  . Fibromyalgia Mother   . Hyperlipidemia Father   . Alzheimer's disease Maternal Grandmother   . Lung cancer Paternal Grandfather   . Diabetes  Maternal Grandfather   . Cancer Maternal Grandfather     Social History Social History   Tobacco Use  . Smoking status: Never Smoker  . Smokeless tobacco: Never Used  Substance Use Topics  . Alcohol use: Yes    Comment: very rare  . Drug use: No     Allergies   Arnica; Duloxetine hcl; and Pseudoephedrine-guaifenesin   Review of Systems Review of Systems  Constitutional: Negative.   Gastrointestinal: Positive for abdominal pain and diarrhea.   Physical Exam Triage Vital Signs ED Triage Vitals  Enc Vitals Group     BP 10/07/18 1443 128/78     Pulse Rate 10/07/18 1443 68     Resp 10/07/18 1443 14     Temp 10/07/18 1443 98.5 F (36.9 C)     Temp Source 10/07/18 1443 Oral     SpO2 10/07/18 1443 100 %     Weight 10/07/18  1440 208 lb (94.3 kg)     Height 10/07/18 1440 5\' 4"  (1.626 m)     Head Circumference --      Peak Flow --      Pain Score 10/07/18 1440 3     Pain Loc --      Pain Edu? --      Excl. in [place]? --    Updated Vital Signs BP 128/78 (BP Location: Left Arm)   Pulse 68   Temp 98.5 F (36.9 C) (Oral)   Resp 14   Ht 5\' 4"  (1.626 m)   Wt 94.3 kg   SpO2 100%   BMI 35.70 kg/m   Visual Acuity Right Eye Distance:   Left Eye Distance:   Bilateral Distance:    Right Eye Near:   Left Eye Near:    Bilateral Near:     Physical Exam  Constitutional: She is oriented to person, place, and time. She appears well-developed. No distress.  HENT:  Head: Normocephalic and atraumatic.  Cardiovascular: Normal rate and regular rhythm.  Pulmonary/Chest: Effort normal and breath sounds normal. She has no wheezes. She has no rales.  Abdominal: Soft. She exhibits no distension. There is no tenderness.  Neurological: She is alert and oriented to person, place, and time.  Psychiatric: She has a normal mood and affect. Her behavior is normal.  Nursing note and vitals reviewed.  UC Treatments / Results  Labs (all labs ordered are listed, but only abnormal results are displayed) Labs Reviewed - No data to display  EKG None  Radiology No results found.  Procedures Procedures (including critical care time)  Medications Ordered in UC Medications - No data to display  Initial Impression / Assessment and Plan / UC Course  I have reviewed the triage vital signs and the nursing notes.  Pertinent labs & imaging results that were available during my care of the patient were reviewed by me and considered in my medical decision making (see chart for details).    37 year old female presents with suspected musculoskeletal/abdominal wall pain.  We discussed the possibility of diverticulitis but the fact that this is unlikely given her recent CT findings as well as her age.  Patient wanted to have a  wait-and-see prescription for an antibiotic regarding diverticulitis just in case.  Treating with meloxicam.  If patient fails to improve or worsens, she can start the Augmentin.  Final Clinical Impressions(s) / UC Diagnoses   Final diagnoses:  LLQ abdominal pain     Discharge Instructions     Use the Mobic as prescribed.  Unlikely to be diverticulitis. You may start the antibiotic if you do not improve.  Take care  Dr. Lacinda Axon     ED Prescriptions    Medication Sig Dispense Auth. Provider   meloxicam (MOBIC) 15 MG tablet Take 1 tablet (15 mg total) by mouth daily as needed. 30 tablet Nylia Gavina G, DO   amoxicillin-clavulanate (AUGMENTIN) 875-125 MG tablet Take 1 tablet by mouth every 12 (twelve) hours. 14 tablet Coral Spikes, DO     Controlled Substance Prescriptions [place] Controlled Substance Registry consulted? Not Applicable   Coral Spikes, DO 10/07/18 1514

## 2018-10-07 NOTE — Discharge Instructions (Signed)
Use the Mobic as prescribed.  Unlikely to be diverticulitis. You may start the antibiotic if you do not improve.  Take care  Dr. Lacinda Axon

## 2018-10-07 NOTE — ED Triage Notes (Signed)
Patient c/o left sided lower abdominal pain that starts when she is walking.  Patient states that pain goes away when she is lying or sitting.  Patient states this has been going on since Monday.  Patient reports some loose stools.

## 2018-10-19 ENCOUNTER — Other Ambulatory Visit: Payer: Self-pay | Admitting: Family Medicine

## 2018-10-19 DIAGNOSIS — I10 Essential (primary) hypertension: Secondary | ICD-10-CM

## 2018-10-19 DIAGNOSIS — G479 Sleep disorder, unspecified: Secondary | ICD-10-CM

## 2018-11-20 ENCOUNTER — Other Ambulatory Visit: Payer: Self-pay | Admitting: Family Medicine

## 2018-11-20 DIAGNOSIS — F4323 Adjustment disorder with mixed anxiety and depressed mood: Secondary | ICD-10-CM

## 2019-03-07 ENCOUNTER — Telehealth: Payer: Self-pay

## 2019-03-07 NOTE — Telephone Encounter (Signed)
Please advise 

## 2019-03-07 NOTE — Telephone Encounter (Signed)
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 Patient states that she is having a hard time falling asleep at night over the past 5-6days, patient also reports having high anxiety do to coronavirus outbreak and states that she is having a difficult time at home. Patient is currently on Lexapro and Hydroxyzine and states that it is not helping with symptom control or sleep. Please advise, patient uses [facility] on [address]. KW

## 2019-03-07 NOTE — Telephone Encounter (Signed)
May increase Lexapro to 2 tablets at bedtime. If further anxiety control needed, may increase hydroxyzine to one tablet twice a day. Call report of response to these changes in 10-14 days.

## 2019-03-08 NOTE — Telephone Encounter (Signed)
LMOVM for pt to return call 

## 2019-03-08 NOTE — Telephone Encounter (Signed)
Patient was advised. Expressed understanding.  

## 2019-06-23 ENCOUNTER — Telehealth: Payer: Self-pay

## 2019-06-23 NOTE — Telephone Encounter (Signed)
Appointment made

## 2019-06-23 NOTE — Telephone Encounter (Signed)
Patient called office wanting to speak with CMA. Patient reports that yesterday evening she began having shooting pain in her right leg that began suddenly and states that pain is increasingly worse since then. Patient describes pain as sharp and shooting and reports difficulty bearing weight, patient states that she has been taking Ibuprofen PRN with no relief. Please advise. KW

## 2019-06-23 NOTE — Telephone Encounter (Signed)
Back of heel pain.  Unable to walk without taking 3 of the 200 mg Ibuprofen.  It started suddenly and she has had no known trauma.  No noticable change in swelling, redness or temperature of area.

## 2019-06-23 NOTE — Telephone Encounter (Signed)
Should schedule appointment to examine heel for Achille's tendinitis or bursitis versus infection.

## 2019-06-24 ENCOUNTER — Ambulatory Visit (INDEPENDENT_AMBULATORY_CARE_PROVIDER_SITE_OTHER): Payer: BC Managed Care – PPO | Admitting: Family Medicine

## 2019-06-24 ENCOUNTER — Other Ambulatory Visit: Payer: Self-pay

## 2019-06-24 ENCOUNTER — Encounter: Payer: Self-pay | Admitting: Family Medicine

## 2019-06-24 VITALS — BP 112/62 | HR 68 | Temp 98.4°F | Resp 16 | Ht 64.0 in | Wt 206.0 lb

## 2019-06-24 DIAGNOSIS — Q872 Congenital malformation syndromes predominantly involving limbs: Secondary | ICD-10-CM

## 2019-06-24 DIAGNOSIS — M7661 Achilles tendinitis, right leg: Secondary | ICD-10-CM | POA: Diagnosis not present

## 2019-06-24 NOTE — Progress Notes (Signed)
Patient: Norma Harris Female    DOB: 04-02-1981   38 y.o.   MRN: [id number] Visit Date: 06/24/2019  Today's Provider: Vernie Murders, PA   Chief Complaint  Patient presents with  . Foot Pain   Subjective:   Foot Pain This is a new problem. The current episode started in the past 7 days (about 2 days). The problem occurs constantly. The problem has been gradually worsening. Associated symptoms include myalgias. Pertinent negatives include no arthralgias or fatigue. The symptoms are aggravated by walking. She has tried NSAIDs for the symptoms.   Patient denies any injures.   Past Medical History:  Diagnosis Date  . Hypertension    Past Surgical History:  Procedure Laterality Date  . HERNIA REPAIR    . LAPAROSCOPIC OVARIAN    . MENISCUS REPAIR     and ACL repai-has screws/staples-RIGHT  . PILONIDAL CYST EXCISION    . VEIN LIGATION AND STRIPPING     x 7   Family History  Problem Relation Age of Onset  . Fibromyalgia Mother   . Hyperlipidemia Father   . Alzheimer's disease Maternal Grandmother   . Lung cancer Paternal Grandfather   . Diabetes Maternal Grandfather   . Cancer Maternal Grandfather    Allergies  Allergen Reactions  . Arnica Rash    Hives, blisters  . Duloxetine Hcl Other (See Comments)    Suicidal feelings  . Pseudoephedrine-Guaifenesin     Other reaction(s): Irregular Heart Rate    Current Outpatient Medications:  .  albuterol (PROVENTIL HFA;VENTOLIN HFA) 108 (90 Base) MCG/ACT inhaler, Inhale 2 puffs into the lungs every 6 (six) hours as needed for wheezing or shortness of breath. May also take 2 puffs prior to exercise., Disp: 1 Inhaler, Rfl: 1 .  cetirizine (ZYRTEC) 10 MG tablet, Take 10 mg by mouth daily., Disp: , Rfl:  .  escitalopram (LEXAPRO) 10 MG tablet, TAKE 1 TABLET BY MOUTH DAILY, Disp: 90 tablet, Rfl: 3 .  hydrOXYzine (ATARAX/VISTARIL) 25 MG tablet, TAKE 1 TABLET(25 MG) BY MOUTH AT BEDTIME AS NEEDED, Disp: 30 tablet, Rfl: 3 .   levonorgestrel (MIRENA) 20 MCG/24HR IUD, by Intrauterine route., Disp: , Rfl:  .  metoprolol succinate (TOPROL-XL) 50 MG 24 hr tablet, TAKE 1/2 TO 1 TABLET BY MOUTH EVERY DAY, Disp: 30 tablet, Rfl: 6 .  amoxicillin-clavulanate (AUGMENTIN) 875-125 MG tablet, Take 1 tablet by mouth every 12 (twelve) hours. (Patient not taking: Reported on 06/24/2019), Disp: 14 tablet, Rfl: 0 .  meloxicam (MOBIC) 15 MG tablet, Take 1 tablet (15 mg total) by mouth daily as needed. (Patient not taking: Reported on 06/24/2019), Disp: 30 tablet, Rfl: 0  Review of Systems  Constitutional: Negative for activity change and fatigue.  Musculoskeletal: Positive for myalgias. Negative for arthralgias and gait problem.   Social History   Tobacco Use  . Smoking status: Never Smoker  . Smokeless tobacco: Never Used  Substance Use Topics  . Alcohol use: Yes    Comment: very rare     Objective:   BP 112/62 (BP Location: Left Arm, Patient Position: Sitting, Cuff Size: Normal)   Pulse 68   Temp 98.4 F (36.9 C)   Resp 16   Ht 5\' 4"  (1.626 m)   Wt 206 lb (93.4 kg)   SpO2 99%   BMI 35.36 kg/m  Vitals:   06/24/19 0838  BP: 112/62  Pulse: 68  Resp: 16  Temp: 98.4 F (36.9 C)  SpO2: 99%  Weight: 206  lb (93.4 kg)  Height: 5\' 4"  (1.626 m)   Physical Exam Constitutional:      General: She is not in acute distress.    Appearance: She is well-developed.  HENT:     Head: Normocephalic and atraumatic.     Right Ear: Hearing normal.     Left Ear: Hearing normal.     Nose: Nose normal.  Eyes:     General: Lids are normal. No scleral icterus.       Right eye: No discharge.        Left eye: No discharge.     Conjunctiva/sclera: Conjunctivae normal.  Pulmonary:     Effort: Pulmonary effort is normal. No respiratory distress.  Musculoskeletal: Normal range of motion.     Comments: Varicose veins in right leg with swelling. No pitting edema. Normal pulses and good ROM of foot and ankle without pain today. Entire  right leg bluish coloration with large number of varicosities.  Skin:    Findings: No lesion or rash.  Neurological:     Mental Status: She is alert and oriented to person, place, and time.  Psychiatric:        Speech: Speech normal.        Behavior: Behavior normal.        Thought Content: Thought content normal.       Assessment & Plan    1. Right Achilles tendinitis Onset of pain in the right posterior heel and Achille's tendon over thep ast couple days. No known injury. Took Ibuprofen and feeling better today. No redness or signs of tendon disruption. May continue 600 mg Ibuprofen QID and apply ice or heat packs for discomfort prn. Recommend stretching exercises and recheck prn. May need physical therapy if no better in 10-14 days.  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 History of multiple venous procedures at [facility] since 2014. Right leg larger than left without pain today and normal pedal pulses.     Vernie Murders, PA  [facility]

## 2019-06-24 NOTE — Patient Instructions (Signed)
Achilles Tendinitis  Achilles tendinitis is inflammation of the tough, cord-like band that attaches the lower leg muscles to the heel bone (Achilles tendon). This is usually caused by overusing the tendon and the ankle joint. Achilles tendinitis usually gets better over time with treatment and caring for yourself at home. It can take weeks or months to heal completely. What are the causes? This condition may be caused by:  A sudden increase in exercise or activity, such as running.  Doing the same exercises or activities (such as jumping) over and over.  Not warming up calf muscles before exercising.  Exercising in shoes that are worn out or not made for exercise.  Having arthritis or a bone growth (spur) on the back of the heel bone. This can rub against the tendon and hurt it.  Age-related wear and tear. Tendons become less flexible with age and more likely to be injured. What are the signs or symptoms? Common symptoms of this condition include:  Pain in the Achilles tendon or in the back of the leg, just above the heel. The pain usually gets worse with exercise.  Stiffness or soreness in the back of the leg, especially in the morning.  Swelling of the skin over the Achilles tendon.  Thickening of the tendon.  Bone spurs at the bottom of the Achilles tendon, near the heel.  Trouble standing on tiptoe. How is this diagnosed? This condition is diagnosed based on your symptoms and a physical exam. You may have tests, including:  X-rays.  MRI. How is this treated? The goal of treatment is to relieve symptoms and help your injury heal. Treatment may include:  Decreasing or stopping activities that caused the tendinitis. This may mean switching to low-impact exercises like biking or swimming.  Icing the injured area.  Doing physical therapy, including strengthening and stretching exercises.  NSAIDs to help relieve pain and swelling.  Using supportive shoes, wraps, heel  lifts, or a walking boot (air cast).  Surgery. This may be done if your symptoms do not improve after 6 months.  Using high-energy shock wave impulses to stimulate the healing process (extracorporeal shock wave therapy). This is rare.  Injection of medicines to help relieve inflammation (corticosteroids). This is rare. Follow these instructions at home: If you have an air cast:  Wear the cast as told by your health care provider. Remove it only as told by your health care provider.  Loosen the cast if your toes tingle, become numb, or turn cold and blue. Activity  Gradually return to your normal activities once your health care provider approves. Do not do activities that cause pain. ? Consider doing low-impact exercises, like cycling or swimming.  If you have an air cast, ask your health care provider when it is safe for you to drive.  If physical therapy was prescribed, do exercises as told by your health care provider or physical therapist. Managing pain, stiffness, and swelling   Raise (elevate) your foot above the level of your heart while you are sitting or lying down.  Move your toes often to avoid stiffness and to lessen swelling.  If directed, put ice on the injured area: ? Put ice in a plastic bag. ? Place a towel between your skin and the bag. ? Leave the ice on for 20 minutes, 2-3 times a day General instructions  If directed, wrap your foot with an elastic bandage or other wrap. This can help keep your tendon from moving too much while it   heals. Your health care provider will show you how to wrap your foot correctly.  Wear supportive shoes or heel lifts only as told by your health care provider.  Take over-the-counter and prescription medicines only as told by your health care provider.  Keep all follow-up visits as told by your health care provider. This is important. Contact a health care provider if:  You have symptoms that gets worse.  You have pain that  does not get better with medicine.  You develop new, unexplained symptoms.  You develop warmth and swelling in your foot.  You have a fever. Get help right away if:  You have a sudden popping sound or sensation in your Achilles tendon followed by severe pain.  You cannot move your toes or foot.  You cannot put any weight on your foot. Summary  Achilles tendinitis is inflammation of the tough, cord-like band that attaches the lower leg muscles to the heel bone (Achilles tendon).  This condition is usually caused by overusing the tendon and the ankle joint. It can also be caused by arthritis or normal aging.  The most common symptoms of this condition include pain, swelling, or stiffness in the Achilles tendon or in the back of the leg.  This condition is usually treated with rest, NSAIDs, and physical therapy. This information is not intended to replace advice given to you by your health care provider. Make sure you discuss any questions you have with your health care provider. Document Released: 09/10/2005 Document Revised: 11/13/2017 Document Reviewed: 10/20/2016 Elsevier Patient Education  2020 [employer].

## 2019-08-11 ENCOUNTER — Other Ambulatory Visit: Payer: Self-pay

## 2019-08-11 DIAGNOSIS — Z20822 Contact with and (suspected) exposure to covid-19: Secondary | ICD-10-CM

## 2019-08-12 LAB — NOVEL CORONAVIRUS, NAA: SARS-CoV-2, NAA: NOT DETECTED

## 2019-08-30 ENCOUNTER — Other Ambulatory Visit: Payer: Self-pay

## 2019-08-30 ENCOUNTER — Encounter: Payer: Self-pay | Admitting: Family Medicine

## 2019-08-30 ENCOUNTER — Ambulatory Visit
Admission: RE | Admit: 2019-08-30 | Discharge: 2019-08-30 | Disposition: A | Discharge status: Home / Self Care | Payer: BC Managed Care – PPO | Source: Ambulatory Visit | Attending: Family Medicine | Admitting: Family Medicine

## 2019-08-30 ENCOUNTER — Ambulatory Visit: Payer: BC Managed Care – PPO | Admitting: Family Medicine

## 2019-08-30 VITALS — BP 130/86 | HR 66 | Temp 96.3°F | Wt 214.0 lb

## 2019-08-30 DIAGNOSIS — G8929 Other chronic pain: Secondary | ICD-10-CM | POA: Insufficient documentation

## 2019-08-30 DIAGNOSIS — M545 Low back pain, unspecified: Secondary | ICD-10-CM

## 2019-08-30 DIAGNOSIS — M79645 Pain in left finger(s): Secondary | ICD-10-CM

## 2019-08-30 DIAGNOSIS — Q872 Congenital malformation syndromes predominantly involving limbs: Secondary | ICD-10-CM

## 2019-08-30 DIAGNOSIS — M542 Cervicalgia: Secondary | ICD-10-CM | POA: Diagnosis not present

## 2019-08-30 NOTE — Progress Notes (Signed)
Norma Harris  MRN: [id number] DOB: [birth year]  Subjective:  HPI   The patient is a 38 year old female who presents for evaluation of a few things.  She complains of left hand pain.  She states for about 3-4 weeks she has been having pain in her index finger and thumb.  She denies any known trauma and states it is not her dominant hand.  It does not radiate into her wrist or up her arm. However she does state that she can tell that her left hand is weaker than the right, she has some swelling in the area and she can not grip as well.   She also complains of right neck and shoulder pain.  She feels this may be from using the mouse.  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  It does not radiate down her arm or cause any numbness or tingling.  The patient also complains of lower back pain.  She states she has had this for months.  She had been going to the chiropractor and was getting some relief.  However the cost has caused her to stop going.  She denies any sciatica involvement and no radiation of the lower back pain.  Patient Active Problem List   Diagnosis Date Noted  . Depression with anxiety 02/24/2017  . Clinical depression 06/07/2015  . Endometriosis 06/07/2015  . Fatigue 06/07/2015  . BP (high blood pressure) 06/07/2015  . Anxiety 11/20/2014  . 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 Cardiac murmur 06/14/2012    Past Medical History:  Diagnosis Date  . Hypertension    Past Surgical History:  Procedure Laterality Date  . HERNIA REPAIR    . LAPAROSCOPIC OVARIAN    . MENISCUS REPAIR     and ACL repai-has screws/staples-RIGHT  . PILONIDAL CYST EXCISION    . VEIN LIGATION AND STRIPPING     x 7   Social History   Socioeconomic History  . Marital status:  Single    Spouse name: single  . Number of children: 0  . Years of education: 58  . Highest education level: Not on file  Occupational History  . Occupation: middle school history teacher  Social Needs  . Financial resource strain: Not on file  . Food insecurity    Worry: Not on file    Inability: Not on file  . Transportation needs    Medical: Not on file    Non-medical: Not on file  Tobacco Use  . Smoking status: Never Smoker  . Smokeless tobacco: Never Used  Substance and Sexual Activity  . Alcohol use: Yes    Comment: very rare  . Drug use: No  . Sexual activity: Never    Birth control/protection: None, I.U.D.  Lifestyle  . Physical activity    Days per week: Not on file    Minutes per session: Not on file  . Stress: Not on file  Relationships  . Social Herbalist on phone: Not on file    Gets together: Not on file    Attends religious service: Not on file    Active member of club or organization: Not on file    Attends meetings of clubs or organizations: Not on file    Relationship status: Not on file  . Intimate partner  violence    Fear of current or ex partner: Not on file    Emotionally abused: Not on file    Physically abused: Not on file    Forced sexual activity: Not on file  Other Topics Concern  . Not on file  Social History Narrative  . Not on file   Outpatient Encounter Medications as of 08/30/2019  Medication Sig  . albuterol (PROVENTIL HFA;VENTOLIN HFA) 108 (90 Base) MCG/ACT inhaler Inhale 2 puffs into the lungs every 6 (six) hours as needed for wheezing or shortness of breath. May also take 2 puffs prior to exercise.  . cetirizine (ZYRTEC) 10 MG tablet Take 10 mg by mouth daily.  Marland Kitchen escitalopram (LEXAPRO) 10 MG tablet TAKE 1 TABLET BY MOUTH DAILY  . hydrOXYzine (ATARAX/VISTARIL) 25 MG tablet TAKE 1 TABLET(25 MG) BY MOUTH AT BEDTIME AS NEEDED  . levonorgestrel (MIRENA) 20 MCG/24HR IUD by Intrauterine route.  . metoprolol succinate  (TOPROL-XL) 50 MG 24 hr tablet TAKE 1/2 TO 1 TABLET BY MOUTH EVERY DAY  . Rivaroxaban (XARELTO) 15 MG TABS tablet Take 15 mg by mouth daily with supper.  . [DISCONTINUED] amoxicillin-clavulanate (AUGMENTIN) 875-125 MG tablet Take 1 tablet by mouth every 12 (twelve) hours. (Patient not taking: Reported on 06/24/2019)  . [DISCONTINUED] meloxicam (MOBIC) 15 MG tablet Take 1 tablet (15 mg total) by mouth daily as needed. (Patient not taking: Reported on 06/24/2019)   No facility-administered encounter medications on file as of 08/30/2019.    Allergies  Allergen Reactions  . Arnica Rash    Hives, blisters  . Duloxetine Hcl Other (See Comments)    Suicidal feelings  . Pseudoephedrine-Guaifenesin     Other reaction(s): Irregular Heart Rate    Review of Systems  Constitutional: Negative for fever and malaise/fatigue.  HENT: Negative for congestion, ear pain and sore throat.   Respiratory: Negative for cough and shortness of breath.   Cardiovascular: Negative for chest pain and palpitations.  Genitourinary: Negative for dysuria and frequency.  Musculoskeletal: Positive for back pain, joint pain, myalgias and neck pain. Negative for falls.    Objective:  BP 130/86 (BP Location: Right Arm, Patient Position: Sitting, Cuff Size: Normal)   Pulse 66   Temp (!) 96.3 F (35.7 C) (Skin)   Wt 214 lb (97.1 kg)   SpO2 98%   BMI 36.73 kg/m   Physical Exam  Constitutional: She is oriented to person, place, and time and well-developed, well-nourished, and in no distress.  HENT:  Head: Normocephalic.  Eyes: Conjunctivae are normal.  Neck: Neck supple.  Pulmonary/Chest: Effort normal.  Abdominal: Soft.  Musculoskeletal: Normal range of motion.        General: Tenderness present.     Comments: Left first metacarpal-phalangeal joint pain.. No redness or swelling. Full ROM.  Swelling of the right leg with discoloration of Klippel-Trenaunay Syndrome.  Neurological: She is alert and oriented to person,  place, and time.  Skin: No rash noted.  Psychiatric: Mood, affect and judgment normal.    Assessment and Plan :   1. Chronic pain of left thumb No known injury. Developed pain in the left thumb over the past 3-4 weeks. Will check for degenerative arthritis versus RA. May be caused by Klippel Trenaunay Syndrome. May use Voltaren Gel or Aspercreme with Lidocaine massaged into the joint. Will get labs and x-ray evaluation. - CBC with Differential/Platelet - C-reactive protein - Rheumatoid Arthritis Profile - DG Hand Complete Left  2. Low back pain without sciatica, unspecified back pain laterality,  unspecified chronicity Low back aching pain with standing or walking to long. Chiropractic treatments help but unable to afford the expense now. Apply moist heat and proceed with rehab exercise program. Check labs for signs of inflammatory disorder. Last CRP was 22.1 on 08-03-19 at [facility]. May need to consider prednisone taper to calm inflammation. - CBC with Differential/Platelet - C-reactive protein  3. Cervical pain (neck) Onset over the past 3-4 weeks and associated with increase in computer/mouse work at home with pandemic issues. Suspect muscle strain and spasms. May use moist heat. Can't use NSAID's with taking Xarelto for vascular issues from [facility]. May try Aspercreme with Lidocaine and Percogesic for spasms. Continue exercises as recommended by chiropractor.  4. 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 Right leg larger than left with discoloration and tight swelling sensation. Will get x-ray of hand to rule out osteohypertrophy. Arthritis and joint pains a rare occurrence with this disease. - CBC with Differential/Platelet - C-reactive protein

## 2019-09-01 ENCOUNTER — Telehealth: Payer: Self-pay | Admitting: Family Medicine

## 2019-09-01 LAB — CBC WITH DIFFERENTIAL/PLATELET
Basophils Absolute: 0.1 10*3/uL (ref 0.0–0.2)
Basos: 0 %
EOS (ABSOLUTE): 0.1 10*3/uL (ref 0.0–0.4)
Eos: 1 %
Hematocrit: 38.1 % (ref 34.0–46.6)
Hemoglobin: 12.9 g/dL (ref 11.1–15.9)
Immature Grans (Abs): 0 10*3/uL (ref 0.0–0.1)
Immature Granulocytes: 0 %
Lymphocytes Absolute: 3 10*3/uL (ref 0.7–3.1)
Lymphs: 27 %
MCH: 30.1 pg (ref 26.6–33.0)
MCHC: 33.9 g/dL (ref 31.5–35.7)
MCV: 89 fL (ref 79–97)
Monocytes Absolute: 0.8 10*3/uL (ref 0.1–0.9)
Monocytes: 7 %
Neutrophils Absolute: 7.2 10*3/uL — ABNORMAL HIGH (ref 1.4–7.0)
Neutrophils: 65 %
Platelets: 349 10*3/uL (ref 150–450)
RBC: 4.29 x10E6/uL (ref 3.77–5.28)
RDW: 13.2 % (ref 11.7–15.4)
WBC: 11.2 10*3/uL — ABNORMAL HIGH (ref 3.4–10.8)

## 2019-09-01 LAB — RHEUMATOID ARTHRITIS PROFILE
Cyclic Citrullin Peptide Ab: 7 units (ref 0–19)
Rheumatoid fact SerPl-aCnc: <10 IU/mL (ref 0.0–13.9)

## 2019-09-01 LAB — C-REACTIVE PROTEIN: CRP: 12 mg/L — ABNORMAL HIGH (ref 0–10)

## 2019-09-01 NOTE — Telephone Encounter (Signed)
Pt wants to discuss her labs and xray results with Simona Huh  CB#  [phone]  [place]

## 2019-09-02 ENCOUNTER — Other Ambulatory Visit: Payer: Self-pay | Admitting: Family Medicine

## 2019-09-02 DIAGNOSIS — D1612 Benign neoplasm of short bones of left upper limb: Secondary | ICD-10-CM

## 2019-09-02 DIAGNOSIS — G8929 Other chronic pain: Secondary | ICD-10-CM

## 2019-09-02 DIAGNOSIS — Q872 Congenital malformation syndromes predominantly involving limbs: Secondary | ICD-10-CM

## 2019-09-02 NOTE — Telephone Encounter (Signed)
Has she tried the Aspercreme with Lidocaine topically while using the Tylenol 650 mg TID with food? If no relief with this combination, may have to try the Tramadol.

## 2019-09-02 NOTE — Telephone Encounter (Signed)
Dennis-I spoke to [place] and explained the situation.  She said she is OK with you going ahead with the referral.   She did ask what she can do about the pain.  She can't take the NSAID b/c of the blood thinner and Tylenol isn't helping.  Thanks

## 2019-09-02 NOTE — Telephone Encounter (Signed)
Patient notified to try medications, she will call back if she needs the Tramadol.

## 2019-09-02 NOTE — Telephone Encounter (Signed)
-----   Message from Margo Common, [place] sent at 09/01/2019  9:11 AM EDT ----- Mild increase in CRP confirms some inflammation in the thumb. Rheumatoid tests are negative. X-ray found an incidental endochondroma (a growth inside of the bone) in the left ring finger middle phalange without significant changes in the thumb. Recommend referral to an orthopedist.

## 2019-09-02 NOTE — Telephone Encounter (Signed)
Please advise 

## 2019-11-03 ENCOUNTER — Ambulatory Visit: Payer: BC Managed Care – PPO | Admitting: Family Medicine

## 2019-11-07 ENCOUNTER — Ambulatory Visit: Payer: BC Managed Care – PPO | Admitting: Family Medicine

## 2019-11-12 ENCOUNTER — Other Ambulatory Visit: Payer: Self-pay | Admitting: Family Medicine

## 2019-11-12 DIAGNOSIS — I10 Essential (primary) hypertension: Secondary | ICD-10-CM

## 2019-11-14 ENCOUNTER — Other Ambulatory Visit: Payer: Self-pay | Admitting: Family Medicine

## 2019-11-14 DIAGNOSIS — F4323 Adjustment disorder with mixed anxiety and depressed mood: Secondary | ICD-10-CM

## 2019-12-08 ENCOUNTER — Ambulatory Visit: Payer: BC Managed Care – PPO | Attending: Internal Medicine

## 2019-12-08 DIAGNOSIS — Z20822 Contact with and (suspected) exposure to covid-19: Secondary | ICD-10-CM

## 2019-12-09 LAB — NOVEL CORONAVIRUS, NAA: SARS-CoV-2, NAA: NOT DETECTED

## 2020-02-12 ENCOUNTER — Ambulatory Visit: Payer: BC Managed Care – PPO | Attending: Internal Medicine

## 2020-02-12 DIAGNOSIS — Z23 Encounter for immunization: Secondary | ICD-10-CM | POA: Insufficient documentation

## 2020-02-12 NOTE — Progress Notes (Signed)
   Covid-19 Vaccination Clinic  Name:  Norma Harris    MRN: [id number] DOB: 1981-02-20  02/12/2020  [name] was observed post Covid-19 immunization for 15 minutes without incidence. She was provided with Vaccine Information Sheet and instruction to access the V-Safe system.   [name] was instructed to call 911 with any severe reactions post vaccine: Marland Kitchen Difficulty breathing  . Swelling of your face and throat  . A fast heartbeat  . A bad rash all over your body  . Dizziness and weakness    Immunizations Administered    Name Date Dose VIS Date Route   Pfizer COVID-19 Vaccine 02/12/2020 11:29 AM 0.3 mL 11/25/2019 Intramuscular   Manufacturer: [place]   Lot: [id number]   [place]: [id number]

## 2020-03-06 ENCOUNTER — Ambulatory Visit: Payer: BC Managed Care – PPO | Attending: Internal Medicine

## 2020-03-06 DIAGNOSIS — Z23 Encounter for immunization: Secondary | ICD-10-CM

## 2020-03-06 NOTE — Progress Notes (Signed)
   Covid-19 Vaccination Clinic  Name:  Norma Harris    MRN: [id number] DOB: 1981/01/06  03/06/2020  [name] was observed post Covid-19 immunization for 15 minutes without incident. She was provided with Vaccine Information Sheet and instruction to access the V-Safe system.   [name] was instructed to call 911 with any severe reactions post vaccine: Marland Kitchen Difficulty breathing  . Swelling of face and throat  . A fast heartbeat  . A bad rash all over body  . Dizziness and weakness   Immunizations Administered    Name Date Dose VIS Date Route   Pfizer COVID-19 Vaccine 03/06/2020  9:06 AM 0.3 mL 11/25/2019 Intramuscular   Manufacturer: [place]   Lot: [id number]   [place]: [id number]

## 2020-03-27 ENCOUNTER — Telehealth: Payer: Self-pay

## 2020-03-27 NOTE — Progress Notes (Signed)
Established patient visit     Patient: Norma Harris   DOB: [birth year]   39 y.o. Female  MRN: [id number] Visit Date: 03/27/2020  Today's healthcare provider: Marcille Buffy, [name]   Clint Bolder as a scribe for [place], [name].,have documented all relevant documentation on the behalf of [place] Jaelon Gatley, [name],as directed by  Marcille Buffy, [name] while in the presence of Marcille Buffy, [place].  Subjective:    Chief Complaint  Patient presents with  . Toe Pain   Toe Pain  There was no injury mechanism (patient reports that she had recieved a pedicure)). The pain is present in the left foot (great toe). Quality: throbbing. The pain is mild. The pain has been constant since onset. Associated symptoms include an inability to bear weight. Pertinent negatives include no loss of motion, loss of sensation, muscle weakness, numbness or tingling. She reports no foreign bodies present. The symptoms are aggravated by weight bearing, movement and palpation. She has tried NSAIDs for the symptoms. The treatment provided no relief.  Denies any injury, noticed after a pedicure - onset two weeks ago.  She has tried cleaning with water. No other treatments tried.  Patient  denies any fever, body aches,chills, rash, chest pain, shortness of breath, nausea, vomiting, or diarrhea.    Patient Active Problem List   Diagnosis Date Noted  . Depression with anxiety 02/24/2017  . Clinical depression 06/07/2015  . Endometriosis 06/07/2015  . Fatigue 06/07/2015  . BP (high blood pressure) 06/07/2015  . Anxiety 11/20/2014  . 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 Cardiac murmur 06/14/2012   Past Medical History:  Diagnosis Date  . Hypertension    Allergies   Allergen Reactions  . Arnica Rash    Hives, blisters  . Duloxetine Hcl Other (See Comments)    Suicidal feelings  . Pseudoephedrine-Guaifenesin     Other reaction(s): Irregular Heart Rate       Medications: Outpatient Medications Prior to Visit  Medication Sig  . albuterol (PROVENTIL HFA;VENTOLIN HFA) 108 (90 Base) MCG/ACT inhaler Inhale 2 puffs into the lungs every 6 (six) hours as needed for wheezing or shortness of breath. May also take 2 puffs prior to exercise.  . cetirizine (ZYRTEC) 10 MG tablet Take 10 mg by mouth daily.  Marland Kitchen escitalopram (LEXAPRO) 10 MG tablet TAKE 1 TABLET BY MOUTH DAILY  . hydrOXYzine (ATARAX/VISTARIL) 25 MG tablet TAKE 1 TABLET(25 MG) BY MOUTH AT BEDTIME AS NEEDED  . levonorgestrel (MIRENA) 20 MCG/24HR IUD by Intrauterine route.  . metoprolol succinate (TOPROL-XL) 50 MG 24 hr tablet TAKE 1/2 TO 1 TABLET BY MOUTH EVERY DAY  . Rivaroxaban (XARELTO) 15 MG TABS tablet Take 15 mg by mouth daily with supper.   No facility-administered medications prior to visit.    Review of Systems  Constitutional: Negative.   HENT: Negative.   Respiratory: Negative.   Cardiovascular: Negative.   Gastrointestinal: Negative.   Musculoskeletal: Positive for gait problem (due to toe issue x 2 weeks - denies  any injury ).  Skin: Positive for color change (redness to left great toe ) and wound (skin is slightly broken where patient tried to clip toenail and removed portion of nail ). Negative for pallor and rash.  Neurological: Negative for tingling and numbness.    She has had recent labs at  [facility].     Objective:       Physical Exam Vitals reviewed. Exam conducted with a chaperone present.  Constitutional:      Appearance: Normal appearance. She is normal weight.  HENT:     Head: Normocephalic and atraumatic.     Right Ear: External ear normal.     Left Ear: External ear normal.  Eyes:     Extraocular Movements: Extraocular movements intact.     Conjunctiva/sclera:  Conjunctivae normal.  Cardiovascular:     Rate and Rhythm: Normal rate and regular rhythm.     Pulses: Normal pulses.     Heart sounds: Normal heart sounds.  Pulmonary:     Effort: Pulmonary effort is normal.     Breath sounds: Normal breath sounds. No stridor. No wheezing, rhonchi or rales.  Musculoskeletal:        General: Normal range of motion.     Cervical back: Normal range of motion and neck supple.     Left foot: Normal range of motion. No deformity, bunion, Charcot foot, foot drop or prominent metatarsal heads.       Feet:  Feet:     Right foot:     Skin integrity: Skin integrity normal.     Left foot:     Protective Sensation: 4 sites tested.     Skin integrity: Erythema and warmth present. No blister.     Toenail Condition: Left toenails are abnormally thick. Fungal disease present.    Comments: Nail discoloration great toe nail yellow in color, medial lateral nail fold with erythema and paronychia present, sensation normal. DP pulse 2+ Small piece of skin dried from patient cutting nail and accidentally clipping skin 2 weeks ago.  Skin:    Findings: Erythema (left great toe as documented ) present.  Neurological:     Mental Status: She is alert and oriented to person, place, and time.  Psychiatric:        Mood and Affect: Mood normal.        Behavior: Behavior normal.     Vitals:   03/28/20 0931  Weight: 203 lb 3.2 oz (92.2 kg)  Height: 5' 4.5" (1.638 m)   Blood pressure 128/84, pulse 62, temperature (!) 97.3 F (36.3 C), temperature source Temporal, resp. rate 15, height 5' 4.5" (1.638 m), weight 203 lb 3.2 oz (92.2 kg), SpO2 99 %.   No results found for any visits on 03/28/20.    Assessment & Plan:      Paronychia of great toe, left - Plan: cephALEXin (KEFLEX) 500 MG capsule, mupirocin cream (BACTROBAN) 2 %  Toenail fungus- left great toe    Begin antibiotic prescribed today, keep skin clean with antibacterial soap and use of warm epsom salts 3-4x a  day. Follow Up in 2 weeks.   She also has yellowing discoloration of nail bed left great toe consistent with fungal nail  Will recheck paronychia in 2 weeks and consider topical nail treatment at that time.   If no improvement needs referral to podiatry.  Meds ordered this encounter  Medications  . cephALEXin (KEFLEX) 500 MG capsule    Sig: Take 1 capsule (500 mg total) by mouth 4 (four) times daily.    Dispense:  40 capsule    Refill:  0  . mupirocin cream (BACTROBAN) 2 %    Sig: Apply 1 application topically 2 (two) times daily.    Dispense:  15 g    Refill:  0   Return if  symptoms worsen or fail to improve. Advised patient call the office or your primary care doctor for an appointment if no improvement within 72 hours or if any symptoms change or worsen at any time  Advised ER or urgent Care if after hours or on weekend. Call 911 for emergency symptoms at any time.Patinet verbalized understanding of all instructions given/reviewed and treatment plan and has no further questions or concerns at this time.     The entirety of the information documented in the History of Present Illness, Review of Systems and Physical Exam were personally obtained by me. Portions of this information were initially documented by the  Certified Medical Assistant Jennings Books whose name is documented in Epic and reviewed by me for thoroughness and accuracy.  I have personally performed the exam and reviewed the chart and it is accurate to the best of my knowledge.  Haematologist has been used and any errors in dictation or transcription are unintentional.  Kelby Aline. [place], [place] [phone] (phone) [phone] (fax)  [place]

## 2020-03-27 NOTE — Telephone Encounter (Signed)
Appointment made  Copied from [place] [id number]. Topic: Appointment Scheduling - Scheduling Inquiry for Clinic >> Mar 27, 2020 12:21 PM Norma Harris wrote: Reason for CRM: Pt has an infected ingrown toenail on her left foot and is not able to walk on it/ Pt asked if it is at all possible for her to come in office today/ please advise

## 2020-03-27 NOTE — Patient Instructions (Addendum)
Ingrown Toenail An ingrown toenail occurs when the corner or sides of a toenail grow into the surrounding skin. This causes discomfort and pain. The big toe is most commonly affected, but any of the toes can be affected. If an ingrown toenail is not treated, it can become infected. What are the causes? This condition may be caused by:  Wearing shoes that are too small or tight.  An injury, such as stubbing your toe or having your toe stepped on.  Improper cutting or care of your toenails.  Having nail or foot abnormalities that were present from birth (congenital abnormalities), such as having a nail that is too big for your toe. What increases the risk? The following factors may make you more likely to develop ingrown toenails:  Age. Nails tend to get thicker with age, so ingrown nails are more common among older people.  Cutting your toenails incorrectly, such as cutting them very short or cutting them unevenly. An ingrown toenail is more likely to get infected if you have:  Diabetes.  Blood flow (circulation) problems. What are the signs or symptoms? Symptoms of an ingrown toenail may include:  Pain, soreness, or tenderness.  Redness.  Swelling.  Hardening of the skin that surrounds the toenail. Signs that an ingrown toenail may be infected include:  Fluid or pus.  Symptoms that get worse instead of better. How is this diagnosed? An ingrown toenail may be diagnosed based on your medical history, your symptoms, and a physical exam. If you have fluid or blood coming from your toenail, a sample may be collected to test for the specific type of bacteria that is causing the infection. How is this treated? Treatment depends on how severe your ingrown toenail is. You may be able to care for your toenail at home.  If you have an infection, you may be prescribed antibiotic medicines.  If you have fluid or pus draining from your toenail, your health care provider may drain  it.  If you have trouble walking, you may be given crutches to use.  If you have a severe or infected ingrown toenail, you may need a procedure to remove part or all of the nail. Follow these instructions at home: Foot care   Do not pick at your toenail or try to remove it yourself.  Soak your foot in warm, soapy water. Do this for 20 minutes, 3 times a day, or as often as told by your health care provider. This helps to keep your toe clean and keep your skin soft.  Wear shoes that fit well and are not too tight. Your health care provider may recommend that you wear open-toed shoes while you heal.  Trim your toenails regularly and carefully. Cut your toenails straight across to prevent injury to the skin at the corners of the toenail. Do not cut your nails in a curved shape.  Keep your feet clean and dry to help prevent infection. Medicines  Take over-the-counter and prescription medicines only as told by your health care provider.  If you were prescribed an antibiotic, take it as told by your health care provider. Do not stop taking the antibiotic even if you start to feel better. Activity  Return to your normal activities as told by your health care provider. Ask your health care provider what activities are safe for you.  Avoid activities that cause pain. General instructions  If your health care provider told you to use crutches to help you move around, use them   as instructed.  Keep all follow-up visits as told by your health care provider. This is important. Contact a health care provider if:  You have more redness, swelling, pain, or other symptoms that do not improve with treatment.  You have fluid, blood, or pus coming from your toenail. Get help right away if:  You have a red streak on your skin that starts at your foot and spreads up your leg.  You have a fever. Summary  An ingrown toenail occurs when the corner or sides of a toenail grow into the surrounding  skin. This causes discomfort and pain. The big toe is most commonly affected, but any of the toes can be affected.  If an ingrown toenail is not treated, it can become infected.  Fluid or pus draining from your toenail is a sign of infection. Your health care provider may need to drain it. You may be given antibiotics to treat the infection.  Trimming your toenails regularly and properly can help you prevent an ingrown toenail. This information is not intended to replace advice given to you by your health care provider. Make sure you discuss any questions you have with your health care provider. Document Revised: 03/25/2019 Document Reviewed: 08/19/2017 Elsevier Patient Education  2020 [employer] is an infection of the skin. It happens near a fingernail or toenail. It may cause pain and swelling around the nail. In some cases, a fluid-filled bump (abscess) can form near or under the nail. Usually, this condition is not serious, and it clears up with treatment. Follow these instructions at home: Wound care  Keep the affected area clean.  Soak the fingers or toes in warm water as told by your doctor. You may be told to do this for 20 minutes, 2-3 times a day.  Keep the area dry when you are not soaking it.  Do not try to drain a fluid-filled bump on your own.  Follow instructions from your doctor about how to take care of the affected area. Make sure you: ? Wash your hands with soap and water before you change your bandage (dressing). If you cannot use soap and water, use hand sanitizer. ? Change your bandage as told by your doctor.  If you had a fluid-filled bump and your doctor drained it, check the area every day for signs of infection. Check for: ? Redness, swelling, or pain. ? Fluid or blood. ? Warmth. ? Pus or a bad smell. Medicines   Take over-the-counter and prescription medicines only as told by your doctor.  If you were prescribed an  antibiotic medicine, take it as told by your doctor. Do not stop taking it even if you start to feel better. General instructions  Avoid touching any chemicals.  Do not pick at the affected area. Prevention  To prevent this condition from happening again: ? Wear rubber gloves when putting your hands in water for washing dishes or other tasks. ? Wear gloves if your hands might touch cleaners or chemicals. ? Avoid injuring your nails or fingertips. ? Do not bite your nails or tear hangnails. ? Do not cut your nails very short. ? Do not cut the skin at the base and sides of the nail (cuticles). ? Use clean nail clippers or scissors when trimming nails. Contact a doctor if:  You feel worse.  You do not get better.  You have more fluid, blood, or pus coming from the affected area.  Your finger or knuckle is swollen  or is hard to move. Get help right away if you have:  A fever or chills.  Redness spreading from the affected area.  Pain in a joint or muscle. Summary  Paronychia is an infection of the skin. It happens near a fingernail or toenail.  This condition may cause pain and swelling around the nail.  Soak the fingers or toes in warm water as told by your doctor.  Usually, this condition is not serious, and it clears up with treatment. This information is not intended to replace advice given to you by your health care provider. Make sure you discuss any questions you have with your health care provider. Document Revised: 12/18/2017 Document Reviewed: 12/14/2017 Elsevier Patient Education  2020 [employer].

## 2020-03-28 ENCOUNTER — Encounter: Payer: Self-pay | Admitting: Adult Health

## 2020-03-28 ENCOUNTER — Ambulatory Visit: Payer: BC Managed Care – PPO | Admitting: Adult Health

## 2020-03-28 ENCOUNTER — Other Ambulatory Visit: Payer: Self-pay

## 2020-03-28 VITALS — BP 128/84 | HR 62 | Temp 97.3°F | Resp 15 | Ht 64.5 in | Wt 203.2 lb

## 2020-03-28 DIAGNOSIS — L03032 Cellulitis of left toe: Secondary | ICD-10-CM

## 2020-03-28 DIAGNOSIS — B351 Tinea unguium: Secondary | ICD-10-CM | POA: Diagnosis not present

## 2020-03-28 MED ORDER — CEPHALEXIN 500 MG PO CAPS: 500.0000 mg | ORAL_CAPSULE | Freq: Four times a day (QID) | ORAL | 0 refills | Status: DC

## 2020-03-28 MED ORDER — MUPIROCIN CALCIUM 2 % EX CREA: 1.0000 "application " | TOPICAL_CREAM | Freq: Two times a day (BID) | CUTANEOUS | 0 refills | Status: DC

## 2020-04-06 ENCOUNTER — Other Ambulatory Visit: Payer: Self-pay | Admitting: Family Medicine

## 2020-04-06 DIAGNOSIS — I10 Essential (primary) hypertension: Secondary | ICD-10-CM

## 2020-04-06 MED ORDER — METOPROLOL SUCCINATE ER 50 MG PO TB24: ORAL_TABLET | ORAL | 0 refills | Status: DC

## 2020-04-09 ENCOUNTER — Telehealth (INDEPENDENT_AMBULATORY_CARE_PROVIDER_SITE_OTHER): Payer: BC Managed Care – PPO | Admitting: Family Medicine

## 2020-04-09 ENCOUNTER — Encounter: Payer: Self-pay | Admitting: Family Medicine

## 2020-04-09 DIAGNOSIS — R197 Diarrhea, unspecified: Secondary | ICD-10-CM | POA: Diagnosis not present

## 2020-04-09 NOTE — Progress Notes (Signed)
MyChart Video Visit    Virtual Visit via Video Note   This visit type was conducted due to national recommendations for restrictions regarding the COVID-19 Pandemic (e.g. social distancing) in an effort to limit this patient's exposure and mitigate transmission in our community. This patient is at least at moderate risk for complications without adequate follow up. This format is felt to be most appropriate for this patient at this time. Physical exam was limited by quality of the video and audio technology used for the visit.   Patient location: home Provider location: office   Patient: Norma Harris   DOB: August 05, 1981   39 y.o. Female  MRN: [id number] Visit Date: 04/09/2020  Today's Provider: Vernie Murders, PA  Subjective:    Chief Complaint  Patient presents with  . 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  Yesterday she began having diarrhea.  She reports that it started out being 1 BM per hour.  She said she has not taken any medication to treat it and she was able to sleep yesterday and have about 4 hour span without any BM.  Today she reports having about 5 bowel movements since midnight.   She denies any associated symptoms and says that she actually feels fine it is just that she is still having the diarrhea and she is unable to return to work until she has a note from her provider.   Past Medical History:  Diagnosis Date  . Hypertension    Past Surgical History:  Procedure Laterality Date  . HERNIA REPAIR    . LAPAROSCOPIC OVARIAN    . MENISCUS REPAIR     and ACL repai-has screws/staples-RIGHT  . PILONIDAL CYST EXCISION    . VEIN LIGATION AND STRIPPING     x 7   Social History   Tobacco Use  . Smoking status: Never Smoker  . Smokeless tobacco: Never Used  Substance Use Topics  . Alcohol use: Yes    Comment: very rare  . Drug use: No   Family Status    Relation Name Status  . Mother  Alive  . Father  Alive  . Sister  Alive  . MGM  Deceased  . PGF  Deceased  . MGF  (Not Specified)   Allergies  Allergen Reactions  . Arnica Rash    Hives, blisters Hives, blisters Hives, blisters   . Duloxetine Other (See Comments)    Other reaction(s): Other (See Comments) Anger, suicidality Suicidal feelings Suicidal feelings   . Duloxetine Hcl Other (See Comments)    Suicidal feelings  . Pseudoephedrine     Other reaction(s): Irregular heart rate  . Pseudoephedrine-Guaifenesin     Other reaction(s): Irregular Heart Rate      Medications: Outpatient Medications Prior to Visit  Medication Sig  . cetirizine (ZYRTEC) 10 MG tablet Take 10 mg by mouth daily.  Marland Kitchen escitalopram (LEXAPRO) 10 MG tablet TAKE 1 TABLET BY MOUTH DAILY  . levonorgestrel (MIRENA) 20 MCG/24HR IUD by Intrauterine route.  . metoprolol succinate (TOPROL-XL) 50 MG 24 hr tablet TAKE 1/2 TO 1 TABLET BY MOUTH EVERY DAY  . cephALEXin (KEFLEX) 500 MG capsule Take 1 capsule (500 mg total) by mouth 4 (four) times daily.  . hydrOXYzine (ATARAX/VISTARIL) 25 MG tablet TAKE 1 TABLET(25 MG) BY MOUTH AT BEDTIME AS NEEDED (Patient not taking: Reported on 03/28/2020)  .  mupirocin cream (BACTROBAN) 2 % Apply 1 application topically 2 (two) times daily.   No facility-administered medications prior to visit.      Review of Systems  Constitutional: Positive for fatigue. Negative for chills and fever.  Respiratory: Negative for cough and shortness of breath.   Gastrointestinal: Positive for diarrhea. Negative for abdominal pain, constipation, nausea and vomiting.        Objective:    There were no vitals taken for this visit.   Physical Exam: WDWN female in no apparent distress.  Head: Normocephalic, atraumatic. Neck: Supple, NROM Respiratory: No apparent distress Psych: Normal mood and affect     Assessment & Plan:    1. Diarrhea, unspecified type Onset yesterday  morning. Some cramps last night and no hematochezia or melena. No fever, weakness, nausea, vomiting, cough, shortness of breath or loss of sense of taste. Had her second COVID vaccination >3 weeks ago. States she has had some students with similar diarrhea symptoms and none have had COVID. 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 Should go on a bland diet with increased fluid (Gatorade) intake. May use Pepto-Bismol or Imodium-AD prn diarrhea. Given note stating she is getting COVID testing and doubt this is anything more than a viral gastroenteritis.    No follow-ups on file.     I discussed the assessment and treatment plan with the patient. The patient was provided an opportunity to ask questions and all were answered. The patient agreed with the plan and demonstrated an understanding of the instructions.   The patient was advised to call back or seek an in-person evaluation if the symptoms worsen or if the condition fails to improve as anticipated.  I provided 15 minutes of non-face-to-face time during this encounter.  Andres Shad, PA, have reviewed all documentation for this visit. The documentation on 04/09/20 for the exam, diagnosis, procedures, and orders are all accurate and complete.   Vernie Murders, [place] [phone] (phone) [phone] (fax)  [place]

## 2020-04-27 ENCOUNTER — Other Ambulatory Visit: Payer: Self-pay

## 2020-04-27 ENCOUNTER — Encounter: Payer: Self-pay | Admitting: Adult Health

## 2020-04-27 ENCOUNTER — Ambulatory Visit (INDEPENDENT_AMBULATORY_CARE_PROVIDER_SITE_OTHER): Payer: BC Managed Care – PPO | Admitting: Adult Health

## 2020-04-27 VITALS — BP 112/72 | HR 76 | Temp 96.6°F | Resp 16 | Wt 204.8 lb

## 2020-04-27 DIAGNOSIS — B351 Tinea unguium: Secondary | ICD-10-CM

## 2020-04-27 DIAGNOSIS — Z975 Presence of (intrauterine) contraceptive device: Secondary | ICD-10-CM | POA: Insufficient documentation

## 2020-04-27 DIAGNOSIS — N83209 Unspecified ovarian cyst, unspecified side: Secondary | ICD-10-CM | POA: Insufficient documentation

## 2020-04-27 DIAGNOSIS — L600 Ingrowing nail: Secondary | ICD-10-CM | POA: Diagnosis not present

## 2020-04-27 HISTORY — DX: Presence of (intrauterine) contraceptive device: Z97.5

## 2020-04-27 MED ORDER — CICLOPIROX 8 % EX SOLN: Freq: Every day | CUTANEOUS | 0 refills | Status: DC

## 2020-04-27 NOTE — Patient Instructions (Signed)
Ingrown Toenail An ingrown toenail occurs when the corner or sides of a toenail grow into the surrounding skin. This causes discomfort and pain. The big toe is most commonly affected, but any of the toes can be affected. If an ingrown toenail is not treated, it can become infected. What are the causes? This condition may be caused by:  Wearing shoes that are too small or tight.  An injury, such as stubbing your toe or having your toe stepped on.  Improper cutting or care of your toenails.  Having nail or foot abnormalities that were present from birth (congenital abnormalities), such as having a nail that is too big for your toe. What increases the risk? The following factors may make you more likely to develop ingrown toenails:  Age. Nails tend to get thicker with age, so ingrown nails are more common among older people.  Cutting your toenails incorrectly, such as cutting them very short or cutting them unevenly. An ingrown toenail is more likely to get infected if you have:  Diabetes.  Blood flow (circulation) problems. What are the signs or symptoms? Symptoms of an ingrown toenail may include:  Pain, soreness, or tenderness.  Redness.  Swelling.  Hardening of the skin that surrounds the toenail. Signs that an ingrown toenail may be infected include:  Fluid or pus.  Symptoms that get worse instead of better. How is this diagnosed? An ingrown toenail may be diagnosed based on your medical history, your symptoms, and a physical exam. If you have fluid or blood coming from your toenail, a sample may be collected to test for the specific type of bacteria that is causing the infection. How is this treated? Treatment depends on how severe your ingrown toenail is. You may be able to care for your toenail at home.  If you have an infection, you may be prescribed antibiotic medicines.  If you have fluid or pus draining from your toenail, your health care provider may drain it.   If you have trouble walking, you may be given crutches to use.  If you have a severe or infected ingrown toenail, you may need a procedure to remove part or all of the nail. Follow these instructions at home: Foot care   Do not pick at your toenail or try to remove it yourself.  Soak your foot in warm, soapy water. Do this for 20 minutes, 3 times a day, or as often as told by your health care provider. This helps to keep your toe clean and keep your skin soft.  Wear shoes that fit well and are not too tight. Your health care provider may recommend that you wear open-toed shoes while you heal.  Trim your toenails regularly and carefully. Cut your toenails straight across to prevent injury to the skin at the corners of the toenail. Do not cut your nails in a curved shape.  Keep your feet clean and dry to help prevent infection. Medicines  Take over-the-counter and prescription medicines only as told by your health care provider.  If you were prescribed an antibiotic, take it as told by your health care provider. Do not stop taking the antibiotic even if you start to feel better. Activity  Return to your normal activities as told by your health care provider. Ask your health care provider what activities are safe for you.  Avoid activities that cause pain. General instructions  If your health care provider told you to use crutches to help you move around, use them   as instructed.  Keep all follow-up visits as told by your health care provider. This is important. Contact a health care provider if:  You have more redness, swelling, pain, or other symptoms that do not improve with treatment.  You have fluid, blood, or pus coming from your toenail. Get help right away if:  You have a red streak on your skin that starts at your foot and spreads up your leg.  You have a fever. Summary  An ingrown toenail occurs when the corner or sides of a toenail grow into the surrounding skin.  This causes discomfort and pain. The big toe is most commonly affected, but any of the toes can be affected.  If an ingrown toenail is not treated, it can become infected.  Fluid or pus draining from your toenail is a sign of infection. Your health care provider may need to drain it. You may be given antibiotics to treat the infection.  Trimming your toenails regularly and properly can help you prevent an ingrown toenail. This information is not intended to replace advice given to you by your health care provider. Make sure you discuss any questions you have with your health care provider. Document Revised: 03/25/2019 Document Reviewed: 08/19/2017 Elsevier Patient Education  [place]. Preventing Toenail Fungus from Recurring   Sanitize your shoes with Mycomist spray or a similar shoe sanitizer spray.  Follow the instructions on the bottle and dry them outside in the sun or with a hairdryer.  We also recommend repeating the sanitization once weekly in shoes you wear most often.   Throw away any shoes you have worn a significant amount without socks-fungus thrives in a warm moist environment and you want to avoid re-infection after your laser procedure   Bleach your socks with regular or color safe bleach   Change your socks regularly to keep your feet clean and dry (especially if you have sweaty feet)-if sweaty feet are a problem, let your doctor know-there is a great lotion that helps with this problem.   Clean your toenail clippers with alcohol before you use them if you do your own toenails and make sure to replace [employer] boards and orange sticks regularly   If you get regular pedicures, bring your own instruments or go to a spa that sterilizes their instruments in an autoclave.

## 2020-04-27 NOTE — Progress Notes (Signed)
Established patient visit   Patient: Norma Harris   DOB: 20-May-1981   39 y.o. Female  MRN: [id number] Visit Date: 04/27/2020  Today's healthcare provider: Marcille Buffy, [name]   Chief Complaint  Patient presents with  . Follow-up   Subjective    HPI Follow up for Paronychia of great toe  The patient was last seen for this 1 months ago. Changes made at last visit include patient was started on Keflex and prescribed Bactroban.  She reports excellent compliance with treatment. She feels that condition is starting to improve but still has discoloration of toe nail and thickening of nail. Patient states she still has tenderness around toe when pressed but is no longer painful. She is not having side effects.    Patient  denies any fever, body aches,chills, rash, chest pain, shortness of breath, nausea, vomiting, or diarrhea.  no other symptoms  -----------------------------------------------------------------------------------------   Patient Active Problem List   Diagnosis Date Noted  . IUD (intrauterine device) in place 04/27/2020  . Cyst of ovary 04/27/2020  . Depression with anxiety 02/24/2017  . Clinical depression 06/07/2015  . Endometriosis 06/07/2015  . Fatigue 06/07/2015  . BP (high blood pressure) 06/07/2015  . Anxiety 11/20/2014  . 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 Cardiac murmur, unspecified 06/14/2012  . Varicose vein 06/14/2012   Past Medical History:  Diagnosis Date  . Hypertension    Allergies  Allergen Reactions  . Arnica Rash    Hives, blisters Hives, blisters Hives, blisters   . Duloxetine Other (See Comments)    Other reaction(s): Other (See Comments) Anger, suicidality Suicidal feelings Suicidal feelings   . Duloxetine Hcl Other  (See Comments)    Suicidal feelings  . Pseudoephedrine     Other reaction(s): Irregular heart rate  . Pseudoephedrine-Guaifenesin     Other reaction(s): Irregular Heart Rate       Medications: Outpatient Medications Prior to Visit  Medication Sig  . cetirizine (ZYRTEC) 10 MG tablet Take 10 mg by mouth daily.  Marland Kitchen escitalopram (LEXAPRO) 10 MG tablet TAKE 1 TABLET BY MOUTH DAILY  . levonorgestrel (MIRENA) 20 MCG/24HR IUD by Intrauterine route.  . metoprolol succinate (TOPROL-XL) 50 MG 24 hr tablet TAKE 1/2 TO 1 TABLET BY MOUTH EVERY DAY  . mupirocin cream (BACTROBAN) 2 % Apply 1 application topically 2 (two) times daily.  . hydrOXYzine (ATARAX/VISTARIL) 25 MG tablet TAKE 1 TABLET(25 MG) BY MOUTH AT BEDTIME AS NEEDED (Patient not taking: Reported on 03/28/2020)  . ID NOW COVID-19 KIT See admin instructions. for testing  . [DISCONTINUED] cephALEXin (KEFLEX) 500 MG capsule Take 1 capsule (500 mg total) by mouth 4 (four) times daily.  . [DISCONTINUED] mupirocin ointment (BACTROBAN) 2 % APPLY TOPICALLY TO THE AFFECTED AREA TWICE DAILY   No facility-administered medications prior to visit.    Review of Systems  Constitutional: Negative.   HENT: Negative.   Respiratory: Negative.   Cardiovascular: Negative.   Gastrointestinal: Negative.   Skin: Positive for color change (left great toenail, skin infection has healed around toe/ no paronychia now, ingrown toenail " feels better". ).  Psychiatric/Behavioral: Negative.     Last CBC Lab Results  Component Value Date   WBC 11.2 (H) 08/30/2019   HGB 12.9 08/30/2019   HCT 38.1 08/30/2019   MCV 89 08/30/2019   MCH 30.1 08/30/2019  RDW 13.2 08/30/2019   PLT 349 08/30/2019      Objective    BP 112/72   Pulse 76   Temp (!) 96.6 F (35.9 C) (Oral)   Resp 16   Wt 204 lb 12.8 oz (92.9 kg)   SpO2 98%   BMI 34.61 kg/m    Physical Exam Constitutional:      Appearance: Normal appearance.  HENT:     Mouth/Throat:     Mouth: Mucous  membranes are moist.  Eyes:     Conjunctiva/sclera: Conjunctivae normal.     Pupils: Pupils are equal, round, and reactive to light.  Cardiovascular:     Rate and Rhythm: Normal rate and regular rhythm.     Pulses: Normal pulses.          Dorsalis pedis pulses are 2+ on the right side and 2+ on the left side.       Posterior tibial pulses are 2+ on the right side and 2+ on the left side.     Heart sounds: Normal heart sounds.  Pulmonary:     Effort: Pulmonary effort is normal. No respiratory distress.     Breath sounds: Normal breath sounds. No stridor. No wheezing, rhonchi or rales.  Chest:     Chest wall: No tenderness.  Abdominal:     Palpations: Abdomen is soft.  Musculoskeletal:     Cervical back: Normal range of motion and neck supple.     Left foot: Normal range of motion. No deformity, bunion, Charcot foot, foot drop or prominent metatarsal heads.       Feet:  Feet:     Left foot:     Skin integrity: No ulcer, blister, skin breakdown, erythema, warmth, callus, dry skin or fissure.     Toenail Condition: Left toenails are abnormally thick. Fungal disease present.    Comments: Ingrown toenail, no drainage or infection, - marked on great left toenail as above.  No signs of infection- resolved since last visit. Mildly tender at nail.  Skin:    General: Skin is warm and dry.     Capillary Refill: Capillary refill takes less than 2 seconds.     Findings: No bruising, erythema, lesion or rash.  Neurological:     Mental Status: She is alert and oriented to person, place, and time.  Psychiatric:        Mood and Affect: Mood normal.        Behavior: Behavior normal.        Thought Content: Thought content normal.        Judgment: Judgment normal.       No results found for any visits on 04/27/20.  Assessment & Plan     Toenail fungus- left great toe  - Plan: ciclopirox (PENLAC) 8 % solution, Ambulatory referral to Podiatry  Ingrown toenail of left foot - Plan:  Ambulatory referral to Podiatry   Orders Placed This Encounter  Procedures  . Ambulatory referral to Podiatry    Referral Priority:   Routine    Referral Type:   Consultation    Referral Reason:   Specialty Services Required    Requested Specialty:   Podiatry    Number of Visits Requested:   1   Call if not heard from referral in 2 weeks or if any signs of infection as discussed return at any time.   Meds ordered this encounter  Medications  . ciclopirox (PENLAC) 8 % solution    Sig: Apply topically at  bedtime. Apply over nail and surrounding skin. Apply daily over previous coat. After seven (7) days, may remove with alcohol and continue cycle.    Dispense:  6.6 mL    Refill:  0    Return if symptoms worsen or fail to improve, for at any time for any worsening symptoms, Go to Emergency room/ urgent care if worse.     Advised patient call the office or your primary care doctor for an appointment if no improvement within 72 hours or if any symptoms change or worsen at any time  Advised ER or urgent Care if after hours or on weekend. Call 911 for emergency symptoms at any time.Patinet verbalized understanding of all instructions given/reviewed and treatment plan and has no further questions or concerns at this time.     IWellington [place] Daneli Butkiewicz, [name], have reviewed all documentation for this visit. The documentation on 04/27/20 for the exam, diagnosis, procedures, and orders are all accurate and complete.   Marcille Buffy, [place] [phone] (phone) [phone] (fax)  [place]

## 2020-05-18 ENCOUNTER — Ambulatory Visit: Payer: BC Managed Care – PPO | Admitting: Podiatry

## 2020-05-18 ENCOUNTER — Encounter: Payer: Self-pay | Admitting: Podiatry

## 2020-05-18 ENCOUNTER — Other Ambulatory Visit: Payer: Self-pay

## 2020-05-18 DIAGNOSIS — L600 Ingrowing nail: Secondary | ICD-10-CM | POA: Diagnosis not present

## 2020-05-18 DIAGNOSIS — B351 Tinea unguium: Secondary | ICD-10-CM | POA: Diagnosis not present

## 2020-05-18 MED ORDER — TERBINAFINE HCL 250 MG PO TABS: 250.0000 mg | ORAL_TABLET | Freq: Every day | ORAL | 0 refills | Status: DC

## 2020-05-18 NOTE — Patient Instructions (Signed)

## 2020-05-20 NOTE — Progress Notes (Signed)
   Subjective: Patient presents today for evaluation of pain to the lateral border left great toe. Patient is concerned for possible ingrown nail. Patient presents today for further treatment and evaluation.  Past Medical History:  Diagnosis Date  . Hypertension     Objective:  General: Well developed, nourished, in no acute distress, alert and oriented x3   Dermatology: Skin is warm, dry and supple bilateral.  Lateral border left great toe appears to be erythematous with evidence of an ingrowing nail. Pain on palpation noted to the border of the nail fold. The remaining nails appear unremarkable at this time. There are no open sores, lesions.  Vascular: Dorsalis Pedis artery and Posterior Tibial artery pedal pulses palpable. No lower extremity edema noted.   Neruologic: Grossly intact via light touch bilateral.  Musculoskeletal: Muscular strength within normal limits in all groups bilateral. Normal range of motion noted to all pedal and ankle joints.   Assesement: #1 Paronychia with ingrowing nail lateral border left great toe #2 Pain in toe #3 Incurvated nail  Plan of Care:  1. Patient evaluated.  2. Discussed treatment alternatives and plan of care. Explained nail avulsion procedure and post procedure course to patient. 3. Patient opted for permanent partial nail avulsion of the lateral border left great toe.  4. Prior to procedure, local anesthesia infiltration utilized using 3 ml of a 50:50 mixture of 2% plain lidocaine and 0.5% plain marcaine in a normal hallux block fashion and a betadine prep performed.  5. Partial permanent nail avulsion with chemical matrixectomy performed using [id number] applications of phenol followed by alcohol flush.  6. Light dressing applied. 7.  Prescription for gentamicin cream  8.  Return to clinic 2 weeks.  Edrick Kins, DPM [facility]  Dr. Edrick Kins, [place]                                          [place], [place] [address]                Office [phone]  Fax [phone]

## 2020-06-05 ENCOUNTER — Ambulatory Visit: Payer: BC Managed Care – PPO | Admitting: Podiatry

## 2020-06-19 ENCOUNTER — Ambulatory Visit: Payer: Self-pay | Admitting: *Deleted

## 2020-06-19 ENCOUNTER — Telehealth: Payer: Self-pay | Admitting: Family Medicine

## 2020-06-19 NOTE — Telephone Encounter (Signed)
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 Thanks   Patient states she is moving and has been doing a lot of packing and the dust has really given her a fit with her allergies. Patient is inquiring if Mucinex is safe with her sudafed allergy.  Advised Mucinex is a different medication all together- but she needs to be careful with getting combination products.( always use pharmacist to verify safe if she has question- they are a good in store resource) Patient is complaining of sinus drainage, sore throat, ear pressure and slight cough. Patient has appointment in office tomorrow- she wants antibiotic to take before her vacation next week.  Reason for Disposition  General information question, no triage required and triager able to answer question  Answer Assessment - Initial Assessment Questions 1. 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  Protocols used: INFORMATION ONLY CALL - NO TRIAGE-A-AH

## 2020-06-19 NOTE — Telephone Encounter (Signed)
Patient says that she is just having some trouble with her sinuses and she has been scheduled for an appointment for tomorrow with Helaine Chess.

## 2020-06-19 NOTE — Telephone Encounter (Signed)
Ask patient what problem she is having. We may be able to put her on a virtual visit or work her in depending on the issue to address. If an urgent problem, may have to consider going to an Urgent Care Clinic.

## 2020-06-19 NOTE — Telephone Encounter (Signed)
What is the best regimen to take otc to treat seasonal allergies? Since patient is asking about Mucinex I was going to suggest regular Mucinex to help break up mucous and clear out congestion and otc Zyrtec for allergy relief such as itchy eyes, scratchy throat, sneezing or runny nose. KW

## 2020-06-19 NOTE — Telephone Encounter (Signed)
Yes plain Mucinex is ok as the only ingredient listed will be Guaifenesin and it helps  to thin secretions- drink plenty of water with it and only take as needed not everyday.   medication list shows she is on daily Zytrec  10 mg qd already.   She can add Flonase nasal spray over the counter per package instructions ok as long as no history of glaucoma.    Also nasal saline rinse per package helps rinse allergens not to use within 2 hours of Flonase.   Showering off after exposure, and washing hair, pillow cases, and changing home air filters frequently   Follow up if any signs of infection or any worsening symptoms with office.

## 2020-06-20 ENCOUNTER — Encounter: Payer: Self-pay | Admitting: Adult Health

## 2020-06-20 ENCOUNTER — Telehealth (INDEPENDENT_AMBULATORY_CARE_PROVIDER_SITE_OTHER): Payer: BC Managed Care – PPO | Admitting: Adult Health

## 2020-06-20 DIAGNOSIS — J069 Acute upper respiratory infection, unspecified: Secondary | ICD-10-CM | POA: Diagnosis not present

## 2020-06-20 DIAGNOSIS — H9201 Otalgia, right ear: Secondary | ICD-10-CM | POA: Insufficient documentation

## 2020-06-20 MED ORDER — SALINE SPRAY 0.65 % NA SOLN: 1.0000 | NASAL | 0 refills | Status: DC | PRN

## 2020-06-20 MED ORDER — AMOXICILLIN 875 MG PO TABS: 875.0000 mg | ORAL_TABLET | Freq: Two times a day (BID) | ORAL | 0 refills | Status: DC

## 2020-06-20 NOTE — Telephone Encounter (Signed)
Patient scheduled a virtual appointment for today at 11:20

## 2020-06-20 NOTE — Patient Instructions (Signed)
Upper Respiratory Infection, Adult An upper respiratory infection (URI) affects the nose, throat, and upper air passages. URIs are caused by germs (viruses). The most common type of URI is often called "the common cold." Medicines cannot cure URIs, but you can do things at home to relieve your symptoms. URIs usually get better within 7-10 days. Follow these instructions at home: Activity  Rest as needed.  If you have a fever, stay home from work or school until your fever is gone, or until your doctor says you may return to work or school. ? You should stay home until you cannot spread the infection anymore (you are not contagious). ? Your doctor may have you wear a face mask so you have less risk of spreading the infection. Relieving symptoms  Gargle with a salt-water mixture 3-4 times a day or as needed. To make a salt-water mixture, completely dissolve -1 tsp of salt in 1 cup of warm water.  Use a cool-mist humidifier to add moisture to the air. This can help you breathe more easily. Eating and drinking   Drink enough fluid to keep your pee (urine) pale yellow.  Eat soups and other clear broths. General instructions   Take over-the-counter and prescription medicines only as told by your doctor. These include cold medicines, fever reducers, and cough suppressants.  Do not use any products that contain nicotine or tobacco. These include cigarettes and e-cigarettes. If you need help quitting, ask your doctor.  Avoid being where people are smoking (avoid secondhand smoke).  Make sure you get regular shots and get the flu shot every year.  Keep all follow-up visits as told by your doctor. This is important. How to avoid spreading infection to others   Wash your hands often with soap and water. If you do not have soap and water, use hand sanitizer.  Avoid touching your mouth, face, eyes, or nose.  Cough or sneeze into a tissue or your sleeve or elbow. Do not cough or sneeze  into your hand or into the air. Contact a doctor if:  You are getting worse, not better.  You have any of these: ? A fever. ? Chills. ? Brown or red mucus in your nose. ? Yellow or brown fluid (discharge)coming from your nose. ? Pain in your face, especially when you bend forward. ? Swollen neck glands. ? Pain with swallowing. ? White areas in the back of your throat. Get help right away if:  You have shortness of breath that gets worse.  You have very bad or constant: ? Headache. ? Ear pain. ? Pain in your forehead, behind your eyes, and over your cheekbones (sinus pain). ? Chest pain.  You have long-lasting (chronic) lung disease along with any of these: ? Wheezing. ? Long-lasting cough. ? Coughing up blood. ? A change in your usual mucus.  You have a stiff neck.  You have changes in your: ? Vision. ? Hearing. ? Thinking. ? Mood. Summary  An upper respiratory infection (URI) is caused by a germ called a virus. The most common type of URI is often called "the common cold."  URIs usually get better within 7-10 days.  Take over-the-counter and prescription medicines only as told by your doctor. This information is not intended to replace advice given to you by your health care provider. Make sure you discuss any questions you have with your health care provider. Document Revised: 12/09/2018 Document Reviewed: 07/24/2017 Elsevier Patient Education  [place]. Otitis Media, Adult  Otitis media means that the middle ear is red and swollen (inflamed) and full of fluid. The condition usually goes away on its own. Follow these instructions at home:  Take over-the-counter and prescription medicines only as told by your doctor.  If you were prescribed an antibiotic medicine, take it as told by your doctor. Do not stop taking the antibiotic even if you start to feel better.  Keep all follow-up visits as told by your doctor. This is important. Contact a doctor  if:  You have bleeding from your nose.  There is a lump on your neck.  You are not getting better in 5 days.  You feel worse instead of better. Get help right away if:  You have pain that is not helped with medicine.  You have swelling, redness, or pain around your ear.  You get a stiff neck.  You cannot move part of your face (paralyzed).  You notice that the bone behind your ear hurts when you touch it.  You get a very bad headache. Summary  Otitis media means that the middle ear is red, swollen, and full of fluid.  This condition usually goes away on its own. In some cases, treatment may be needed.  If you were prescribed an antibiotic medicine, take it as told by your doctor. This information is not intended to replace advice given to you by your health care provider. Make sure you discuss any questions you have with your health care provider. Document Revised: 11/13/2017 Document Reviewed: 12/22/2016 Elsevier Patient Education  2020 [employer].

## 2020-06-20 NOTE — Progress Notes (Signed)
MyChart Video Visit    Virtual Visit via Video Note   This visit type was conducted due to national recommendations for restrictions regarding the COVID-19 Pandemic (e.g. social distancing) in an effort to limit this patient's exposure and mitigate transmission in our community. This patient is at least at moderate risk for complications without adequate follow up. This format is felt to be most appropriate for this patient at this time. Physical exam was limited by quality of the video and audio technology used for the visit.   Patient location: at home  Provider location: Provider: Provider's office at  [facility], [place].   Patient: Norma Harris   DOB: [birth year]   39 y.o. Female  MRN: [id number] Visit Date: 06/20/2020  Today's healthcare provider: Marcille Buffy, [name]   Chief Complaint  Patient presents with  . URI   I,Latasha Walston,acting as a scribe for [employer], [name].,have documented all relevant documentation on the behalf of Marcille Buffy, [name],as directed by  Marcille Buffy, [name] while in the presence of Marcille Buffy, [place].  Subjective    URI  This is a new problem. Episode onset: Saturday. The problem has been gradually improving. There has been no fever. Associated symptoms include congestion, ear pain (right), rhinorrhea, sinus pain, sneezing and a sore throat. Pertinent negatives include no coughing, headaches, neck pain or rash. Treatments tried: Zicam and Ibuprofen. The treatment provided mild relief.   Onset July 3rd. She denies any symptoms prior to this.  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  She recently moved into a new home and new school.   She has been cleaning her old apartment and around a lot of dust and smoke in home and she started with sore throat Saturday, Sunday post nasal drip, sinus  pressure. Right ear pressure fullness and pain. No ear drainage or trauma.   Clear nasal congestion mixed yellow discharge.  Sore throat resolved.  Mild occasional non productive cough.   She reports history of bronchitis in past. She has been resting, isolating, and drinking fluids.  She did rapid covid test today 06/20/2020 that was negative.   No loss of taste or smell. No fevers.   Patient  denies any fever, body aches,chills, rash, chest pain, shortness of breath, nausea, vomiting, or diarrhea.  Denies dizziness, lightheadedness, pre syncopal or syncopal episodes.   No LMP recorded. (Menstrual status: IUD).  denies any pregnancy.     Patient Active Problem List   Diagnosis Date Noted  . Acute ear pain, right 06/20/2020  . Upper respiratory infection, acute 06/20/2020  . IUD (intrauterine device) in place 04/27/2020  . Cyst of ovary 04/27/2020  . Depression with anxiety 02/24/2017  . Clinical depression 06/07/2015  . Endometriosis 06/07/2015  . Fatigue 06/07/2015  . BP (high blood pressure) 06/07/2015  . Anxiety 11/20/2014  . 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 Cardiac murmur, unspecified 06/14/2012  . Varicose vein 06/14/2012   Past Medical History:  Diagnosis Date  . Hypertension    Past Surgical History:  Procedure Laterality Date  . HERNIA REPAIR    . LAPAROSCOPIC OVARIAN    . MENISCUS REPAIR     and ACL repai-has screws/staples-RIGHT  . PILONIDAL CYST  EXCISION    . VEIN LIGATION AND STRIPPING     x 7   Social History   Tobacco Use  . Smoking status: Never Smoker  . Smokeless tobacco: Never Used  Vaping Use  . Vaping Use: Never used  Substance Use Topics  . Alcohol use: Yes    Comment: very rare  . Drug use: No   Social History   Socioeconomic History  .  Marital status: Single    Spouse name: single  . Number of children: 0  . Years of education: 25  . Highest education level: Not on file  Occupational History  . Occupation: middle school history teacher  Tobacco Use  . Smoking status: Never Smoker  . Smokeless tobacco: Never Used  Vaping Use  . Vaping Use: Never used  Substance and Sexual Activity  . Alcohol use: Yes    Comment: very rare  . Drug use: No  . Sexual activity: Never    Birth control/protection: None, I.U.D.  Other Topics Concern  . Not on file  Social History Narrative  . Not on file   Social Determinants of Health   Financial Resource Strain:   . Difficulty of Paying Living Expenses:   Food Insecurity:   . Worried About Charity fundraiser in the Last Year:   . Arboriculturist in the Last Year:   Transportation Needs:   . Film/video editor (Medical):   Marland Kitchen Lack of Transportation (Non-Medical):   Physical Activity:   . Days of Exercise per Week:   . Minutes of Exercise per Session:   Stress:   . Feeling of Stress :   Social Connections:   . Frequency of Communication with Friends and Family:   . Frequency of Social Gatherings with Friends and Family:   . Attends Religious Services:   . Active Member of Clubs or Organizations:   . Attends Archivist Meetings:   Marland Kitchen Marital Status:   Intimate Partner Violence:   . Fear of Current or Ex-Partner:   . Emotionally Abused:   Marland Kitchen Physically Abused:   . Sexually Abused:       Medications: Outpatient Medications Prior to Visit  Medication Sig  . cetirizine (ZYRTEC) 10 MG tablet Take 10 mg by mouth daily.  Marland Kitchen escitalopram (LEXAPRO) 10 MG tablet TAKE 1 TABLET BY MOUTH DAILY  . ID NOW COVID-19 KIT See admin instructions. for testing  . levonorgestrel (MIRENA) 20 MCG/24HR IUD by Intrauterine route.  . metoprolol succinate (TOPROL-XL) 50 MG 24 hr tablet TAKE 1/2 TO 1 TABLET BY MOUTH EVERY DAY  . mupirocin cream (BACTROBAN) 2 % Apply 1 application  topically 2 (two) times daily.  Marland Kitchen terbinafine (LAMISIL) 250 MG tablet Take 1 tablet (250 mg total) by mouth daily.   No facility-administered medications prior to visit.    Review of Systems  Constitutional: Negative.   HENT: Positive for congestion, ear pain (right), rhinorrhea, sinus pain, sneezing and sore throat.   Respiratory: Negative.  Negative for cough.   Cardiovascular: Negative.   Musculoskeletal: Negative.  Negative for neck pain.  Skin: Negative for rash.  Neurological: Negative for headaches.    Last CBC Lab Results  Component Value Date   WBC 11.2 (H) 08/30/2019   HGB 12.9 08/30/2019   HCT 38.1 08/30/2019   MCV 89 08/30/2019   MCH 30.1 08/30/2019   RDW 13.2 08/30/2019   PLT 349 94/70/9628   Last metabolic panel Lab Results  Component Value Date  GLUCOSE 89 04/02/2018   NA 139 04/02/2018   K 4.4 04/02/2018   CL 104 04/02/2018   CO2 21 04/02/2018   BUN 10 04/02/2018   CREATININE 0.79 04/02/2018   GFRNONAA 97 04/02/2018   GFRAA 111 04/02/2018   CALCIUM 9.6 04/02/2018   PROT 7.1 04/02/2018   ALBUMIN 4.4 04/02/2018   LABGLOB 2.7 04/02/2018   AGRATIO 1.6 04/02/2018   BILITOT 0.3 04/02/2018   ALKPHOS 71 04/02/2018   AST 13 04/02/2018   ALT 15 04/02/2018   ANIONGAP 9 08/28/2015      Objective    There were no vitals taken for this visit. BP Readings from Last 3 Encounters:  04/27/20 112/72  03/28/20 128/84  08/30/19 130/86      Physical Exam  Reports afebrile no fever reducers.  No other vitals available.    Patient is alert and oriented and responsive to questions Engages in conversation with provider. Speaks in full sentences without any pauses without any shortness of breath or distress.    Assessment & Plan    Acute ear pain, right - Plan: amoxicillin (AMOXIL) 875 MG tablet  Upper respiratory infection, acute - Plan: sodium chloride (OCEAN) 0.65 % SOLN nasal spray   Discussed likely viral URI and likely course, and expected  course.   Acute pain of right ear will give treatment for otitis media discussed other differentials for ear pain, no recent swimming, no pain with tugging on ear, no discharge. Discussed sinus pressure and Eustachian tubes. She does note   history of ear infections as a child, none recent can not see in office due to Covid protocol will send Amoxil if needed or no improvement with symptomatic treatment. Can try Benadryl at bedtime per package and use Zyrtec still daily. Nasal saline per package instructions. . Plan Mucinex ok.   She can not take pseudoephedrine and Flonase causes her nose bleeds.    Red Flags discussed. The patient was given clear instructions to go to ER or return to medical center if any red flags develop, symptoms do not improve, worsen or new problems develop. They verbalized understanding.  Be seen in person if symptoms not improving or worsening at anytime. Repeat Covid testing id no improvement by 06/25/20.  Return in about 1 week (around 06/27/2020), or if symptoms worsen or fail to improve, for at any time for any worsening symptoms, Go to Emergency room/ urgent care if worse.     I discussed the assessment and treatment plan with the patient. The patient was provided an opportunity to ask questions and all were answered. The patient agreed with the plan and demonstrated an understanding of the instructions.   The patient was advised to call back or seek an in-person evaluation if the symptoms worsen or if the condition fails to improve as anticipated.  I provided 22 minutes of non-face-to-face time during this encounter.  IWellington Hampshire Cortnee Steinmiller, [name], have reviewed all documentation for this visit. The documentation on 06/20/20 for the exam, diagnosis, procedures, and orders are all accurate and complete.   Marcille Buffy, [place] [phone] (phone) [phone] (fax)  [place]

## 2020-07-31 NOTE — Progress Notes (Signed)
MyChart Video Visit    Virtual Visit via Video Note   This visit type was conducted due to national recommendations for restrictions regarding the COVID-19 Pandemic (e.g. social distancing) in an effort to limit this patient's exposure and mitigate transmission in our community. This patient is at least at moderate risk for complications without adequate follow up. This format is felt to be most appropriate for this patient at this time. Physical exam was limited by quality of the video and audio technology used for the visit.   Patient location: Home Provider location: Office    I discussed the limitations of evaluation and management by telemedicine and the availability of in person appointments. The patient expressed understanding and agreed to proceed.  Interactive audio and video communications failed due to technical difficulties. Continued visit with audio only interaction with patient agreement.    Patient: Norma Harris   DOB: Jun 23, 1981   39 y.o. Female  MRN: [id number] Visit Date: 08/01/2020  Today's healthcare provider: Trinna Post, PA-C   Chief Complaint  Patient presents with  . Insomnia   Subjective    HPI  Insomnia  Patient reports three month history of insomnia. 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 She has trouble falling asleep for 1.5 hours. When she does fall asleep she wakes up frequently. She has tried melatonin and this was not helpful. She has tried warm showers, not watching TV. She does not have a history of sleep apnea. She has had a sleep test which was negative. She currently sees a Social worker.   Anxiety, Follow-up  She was last seen for anxiety 6 months ago. Changes made at last visit include continue Lexapro 20.   She reports good compliance with treatment. She reports excellent tolerance of treatment. She is not having side effects.   She feels her anxiety is moderate and Worse since last  visit.  Symptoms: No chest pain No difficulty concentrating  No dizziness No fatigue  No feelings of losing control Yes insomnia  No irritable No palpitations  No panic attacks No racing thoughts  No shortness of breath No sweating  No tremors/shakes    GAD-7 Results No flowsheet data found.  PHQ-9 Scores PHQ9 SCORE ONLY 06/09/2017 02/24/2017  PHQ-9 Total Score 13 8    ---------------------------------------------------------------------------------------------------    Medications: Outpatient Medications Prior to Visit  Medication Sig  . cetirizine (ZYRTEC) 10 MG tablet Take 10 mg by mouth daily.  . ID NOW COVID-19 KIT See admin instructions. for testing  . levonorgestrel (MIRENA) 20 MCG/24HR IUD by Intrauterine route.  . metoprolol succinate (TOPROL-XL) 50 MG 24 hr tablet TAKE 1/2 TO 1 TABLET BY MOUTH EVERY DAY  . [DISCONTINUED] escitalopram (LEXAPRO) 10 MG tablet TAKE 1 TABLET BY MOUTH DAILY  . amoxicillin (AMOXIL) 875 MG tablet Take 1 tablet (875 mg total) by mouth 2 (two) times daily. (Patient not taking: Reported on 08/01/2020)  . mupirocin cream (BACTROBAN) 2 % Apply 1 application topically 2 (two) times daily. (Patient not taking: Reported on 08/01/2020)  . sodium chloride (OCEAN) 0.65 % SOLN nasal spray Place 1 spray into both nostrils as needed for congestion. (Patient not taking: Reported on 08/01/2020)  . terbinafine (LAMISIL) 250 MG tablet Take 1 tablet (250 mg total) by mouth daily. (Patient not taking: Reported on 08/01/2020)   No facility-administered medications prior to visit.    Review of Systems  Constitutional: Negative.   Respiratory: Negative.   Cardiovascular: Negative.  Neurological: Negative.   Psychiatric/Behavioral: Positive for sleep disturbance.      Objective    There were no vitals taken for this visit.   Physical Exam Constitutional:      Appearance: Normal appearance.  Pulmonary:     Effort: Pulmonary effort is normal. No  respiratory distress.  Neurological:     Mental Status: She is alert.  Psychiatric:        Mood and Affect: Mood normal.        Behavior: Behavior normal.        Assessment & Plan    1. Insomnia, unspecified type  Trial of trazodone as below. Counseled about primary vs secondary insomnia and necessity to address underlying cause.   - traZODone (DESYREL) 50 MG tablet; Take 0.5-1 tablets (25-50 mg total) by mouth at bedtime as needed for sleep.  Dispense: 30 tablet; Refill: 3  2. Anxiety  Will increase lexapro as below. She will continue to see counselor. Follow up with PCP in 6 weeks.   - escitalopram (LEXAPRO) 20 MG tablet; Take 1 tablet (20 mg total) by mouth daily.  Dispense: 90 tablet; Refill: 0    I discussed the assessment and treatment plan with the patient. The patient was provided an opportunity to ask questions and all were answered. The patient agreed with the plan and demonstrated an understanding of the instructions.   The patient was advised to call back or seek an in-person evaluation if the symptoms worsen or if the condition fails to improve as anticipated.   ITrinna Post, PA-C, have reviewed all documentation for this visit. The documentation on 08/01/20 for the exam, diagnosis, procedures, and orders are all accurate and complete.   Paulene [facility] [phone] (phone) [phone] (fax)  [place]

## 2020-08-01 ENCOUNTER — Encounter: Payer: Self-pay | Admitting: Physician Assistant

## 2020-08-01 ENCOUNTER — Telehealth (INDEPENDENT_AMBULATORY_CARE_PROVIDER_SITE_OTHER): Payer: BC Managed Care – PPO | Admitting: Physician Assistant

## 2020-08-01 DIAGNOSIS — F419 Anxiety disorder, unspecified: Secondary | ICD-10-CM

## 2020-08-01 DIAGNOSIS — G4700 Insomnia, unspecified: Secondary | ICD-10-CM | POA: Diagnosis not present

## 2020-08-01 MED ORDER — TRAZODONE HCL 50 MG PO TABS: 25.0000 mg | ORAL_TABLET | Freq: Every evening | ORAL | 3 refills | Status: DC | PRN

## 2020-08-01 MED ORDER — ESCITALOPRAM OXALATE 20 MG PO TABS: 20.0000 mg | ORAL_TABLET | Freq: Every day | ORAL | 0 refills | Status: DC

## 2020-12-15 ENCOUNTER — Other Ambulatory Visit: Payer: Self-pay | Admitting: Physician Assistant

## 2020-12-15 DIAGNOSIS — G4700 Insomnia, unspecified: Secondary | ICD-10-CM

## 2020-12-22 ENCOUNTER — Other Ambulatory Visit: Payer: Self-pay | Admitting: Family Medicine

## 2020-12-22 DIAGNOSIS — I10 Essential (primary) hypertension: Secondary | ICD-10-CM

## 2021-02-20 ENCOUNTER — Other Ambulatory Visit: Payer: Self-pay | Admitting: Physician Assistant

## 2021-02-20 DIAGNOSIS — F419 Anxiety disorder, unspecified: Secondary | ICD-10-CM

## 2021-02-20 NOTE — Telephone Encounter (Signed)
Courtesy refill. Patient needs office visit. Requested Prescriptions  Pending Prescriptions Disp Refills  . escitalopram (LEXAPRO) 20 MG tablet [Pharmacy Med Name: ESCITALOPRAM 20MG  TABLETS] 30 tablet 0    Sig: TAKE 1 TABLET(20 MG) BY MOUTH DAILY     Psychiatry:  Antidepressants - SSRI Failed - 02/20/2021 10:39 PM      Failed - Completed PHQ-2 or PHQ-9 in the last 360 days      Failed - Valid encounter within last 6 months    Recent Outpatient Visits          6 months ago Insomnia, unspecified type   [place], Adriana M, PA-C   8 months ago Acute ear pain, right   [facility] [place], Kelby Aline, [name]   9 months ago Toenail fungus- left great toe    [facility], Kelby Aline, [name]   10 months ago Diarrhea, unspecified type   [place], PA-C   10 months ago Paronychia of great toe, left   [facility], Kelby Aline, [name]

## 2021-03-13 ENCOUNTER — Telehealth (INDEPENDENT_AMBULATORY_CARE_PROVIDER_SITE_OTHER): Payer: BC Managed Care – PPO | Admitting: Adult Health

## 2021-03-13 ENCOUNTER — Encounter: Payer: Self-pay | Admitting: Adult Health

## 2021-03-13 DIAGNOSIS — J3489 Other specified disorders of nose and nasal sinuses: Secondary | ICD-10-CM | POA: Insufficient documentation

## 2021-03-13 DIAGNOSIS — U071 COVID-19: Secondary | ICD-10-CM | POA: Insufficient documentation

## 2021-03-13 MED ORDER — PREDNISONE 10 MG (21) PO TBPK: ORAL_TABLET | ORAL | 0 refills | Status: DC

## 2021-03-13 NOTE — Patient Instructions (Signed)
Prednisone tablets What is this medicine? PREDNISONE (PRED ni sone) is a corticosteroid. It is commonly used to treat inflammation of the skin, joints, lungs, and other organs. Common conditions treated include asthma, allergies, and arthritis. It is also used for other conditions, such as blood disorders and diseases of the adrenal glands. This medicine may be used for other purposes; ask your health care provider or pharmacist if you have questions. COMMON BRAND NAME(S): Deltasone, Predone, Sterapred, Sterapred DS What should I tell my health care provider before I take this medicine? They need to know if you have any of these conditions:  Cushing's syndrome  diabetes  glaucoma  heart disease  high blood pressure  infection (especially a virus infection such as chickenpox, cold sores, or herpes)  kidney disease  liver disease  mental illness  myasthenia gravis  osteoporosis  seizures  stomach or intestine problems  thyroid disease  an unusual or allergic reaction to lactose, prednisone, other medicines, foods, dyes, or preservatives  pregnant or trying to get pregnant  breast-feeding How should I use this medicine? Take this medicine by mouth with a glass of water. Follow the directions on the prescription label. Take this medicine with food. If you are taking this medicine once a day, take it in the morning. Do not take more medicine than you are told to take. Do not suddenly stop taking your medicine because you may develop a severe reaction. Your doctor will tell you how much medicine to take. If your doctor wants you to stop the medicine, the dose may be slowly lowered over time to avoid any side effects. Talk to your pediatrician regarding the use of this medicine in children. Special care may be needed. Overdosage: If you think you have taken too much of this medicine contact a poison control center or emergency room at once. NOTE: This medicine is only for  you. Do not share this medicine with others. What if I miss a dose? If you miss a dose, take it as soon as you can. If it is almost time for your next dose, talk to your doctor or health care professional. You may need to miss a dose or take an extra dose. Do not take double or extra doses without advice. What may interact with this medicine? Do not take this medicine with any of the following medications:  metyrapone  mifepristone This medicine may also interact with the following medications:  aminoglutethimide  amphotericin B  aspirin and aspirin-like medicines  barbiturates  certain medicines for diabetes, like glipizide or glyburide  cholestyramine  cholinesterase inhibitors  cyclosporine  digoxin  diuretics  ephedrine  female hormones, like estrogens and birth control pills  isoniazid  ketoconazole  NSAIDS, medicines for pain and inflammation, like ibuprofen or naproxen  phenytoin  rifampin  toxoids  vaccines  warfarin This list may not describe all possible interactions. Give your health care provider a list of all the medicines, herbs, non-prescription drugs, or dietary supplements you use. Also tell them if you smoke, drink alcohol, or use illegal drugs. Some items may interact with your medicine. What should I watch for while using this medicine? Visit your doctor or health care professional for regular checks on your progress. If you are taking this medicine over a prolonged period, carry an identification card with your name and address, the type and dose of your medicine, and your doctor's name and address. This medicine may increase your risk of getting an infection. Tell your  doctor or health care professional if you are around anyone with measles or chickenpox, or if you develop sores or blisters that do not heal properly. If you are going to have surgery, tell your doctor or health care professional that you have taken this medicine within the  last twelve months. Ask your doctor or health care professional about your diet. You may need to lower the amount of salt you eat. This medicine may increase blood sugar. Ask your healthcare provider if changes in diet or medicines are needed if you have diabetes. What side effects may I notice from receiving this medicine? Side effects that you should report to your doctor or health care professional as soon as possible:  allergic reactions like skin rash, itching or hives, swelling of the face, lips, or tongue  changes in emotions or moods  changes in vision  depressed mood  eye pain  fever or chills, cough, sore throat, pain or difficulty passing urine  signs and symptoms of high blood sugar such as being more thirsty or hungry or having to urinate more than normal. You may also feel very tired or have blurry vision.  swelling of ankles, feet Side effects that usually do not require medical attention (report to your doctor or health care professional if they continue or are bothersome):  confusion, excitement, restlessness  headache  nausea, vomiting  skin problems, acne, thin and shiny skin  trouble sleeping  weight gain This list may not describe all possible side effects. Call your doctor for medical advice about side effects. You may report side effects to FDA at 1-800-FDA-1088. Where should I keep my medicine? Keep out of the reach of children. Store at room temperature between 15 and 30 degrees C (59 and 86 degrees F). Protect from light. Keep container tightly closed. Throw away any unused medicine after the expiration date. NOTE: This sheet is a summary. It may not cover all possible information. If you have questions about this medicine, talk to your doctor, pharmacist, or health care provider.  2021 Elsevier/Gold Standard (2018-08-31 10:54:22)     10 Things You Can Do to Manage Your COVID-19 Symptoms at Home If you have possible or confirmed  COVID-19: 1. Stay home except to get medical care. 2. Monitor your symptoms carefully. If your symptoms get worse, call your healthcare provider immediately. 3. Get rest and stay hydrated. 4. If you have a medical appointment, call the healthcare provider ahead of time and tell them that you have or may have COVID-19. 5. For medical emergencies, call 911 and notify the dispatch personnel that you have or may have COVID-19. 6. Cover your cough and sneezes with a tissue or use the inside of your elbow. 7. Wash your hands often with soap and water for at least 20 seconds or clean your hands with an alcohol-based hand sanitizer that contains at least 60% alcohol. 8. As much as possible, stay in a specific room and away from other people in your home. Also, you should use a separate bathroom, if available. If you need to be around other people in or outside of the home, wear a mask. 9. Avoid sharing personal items with other people in your household, like dishes, towels, and bedding. 10. Clean all surfaces that are touched often, like counters, tabletops, and doorknobs. Use household cleaning sprays or wipes according to the label instructions. [email or web address] 06/29/2020 This information is not intended to replace advice given to you by your health care provider. Make sure you  discuss any questions you have with your health care provider. Document Revised: 10/15/2020 Document Reviewed: 10/15/2020 Elsevier Patient Education  2021 [employer].

## 2021-03-13 NOTE — Progress Notes (Signed)
MyChart Video Visit    Virtual Visit via Video Note   This visit type was conducted due to national recommendations for restrictions regarding the COVID-19 Pandemic (e.g. social distancing) in an effort to limit this patient's exposure and mitigate transmission in our community. This patient is at least at moderate risk for complications without adequate follow up. This format is felt to be most appropriate for this patient at this time. Physical exam was limited by quality of the video and audio technology used for the visit.  Parties involved in visit as below:    Patient location: at home  Provider location: Provider: Provider's office at  [facility], [place].     I discussed the limitations of evaluation and management by telemedicine and the availability of in person appointments. The patient expressed understanding and agreed to proceed.  Patient: Norma Harris   DOB: [birth year]   40 y.o. Female  MRN: [id number] Visit Date: 03/13/2021  Today's healthcare provider: Marcille Buffy, [name]   Chief Complaint  Patient presents with  . Covid Positive    Patient presents today to address recent at home covid test that became positive on 03/10/21. 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 Patient states that she took a PCR test on 03/12/21 but reports that results were inconclusive and plans to retest again in 24hrs.    Subjective    HPI HPI    Covid Positive     Additional comments: Patient presents today to address recent at home covid test that became positive on 03/10/21. 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 Patient states that she took a PCR test on 03/12/21 but reports that results were inconclusive and plans to retest again in 24hrs.        Last edited by Minette Headland, CMA on 03/13/2021  9:11 AM. (History)     clear productive cough mostly. NO ear pain. Post nasal drip. Ear pressure/ fullness.  Fever was for 48 hours and resolved now.  She started with symptoms on Saturday 03/09/21.  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  She has a positive rapid test at home and PCR returned non conclusive.  Sinus pressure/ post nasal drip.   She is sleeping at night.   She could have been exposed to strep. Sore throat.  She is fully vaccinated and has a booster.  Patient  denies any fever today,chills, rash, chest pain, shortness of breath, nausea, vomiting, or diarrhea.  Denies dizziness, lightheadedness, pre syncopal or syncopal episodes.  Pulse oximetry is above 95 % at all time.  99.5 temp was highest temp she had oral and none today.  No LMP recorded. (Menstrual status: IUD). Denies any pregnancy.     Medications: Outpatient Medications Prior to Visit  Medication Sig  . cetirizine (ZYRTEC) 10 MG tablet Take 10 mg by mouth daily.  Marland Kitchen escitalopram (LEXAPRO) 20 MG tablet TAKE 1 TABLET(20 MG) BY MOUTH  DAILY  . ID NOW COVID-19 KIT See admin instructions. for testing  . levonorgestrel (MIRENA) 20 MCG/24HR IUD by Intrauterine route.  . metoprolol succinate (TOPROL-XL) 50 MG 24 hr tablet TAKE 1/2 TO 1 TABLET BY MOUTH EVERY DAY  . mupirocin cream (BACTROBAN) 2 % Apply 1 application topically 2 (two) times daily. (Patient not taking: Reported on 08/01/2020)  . sodium chloride (OCEAN) 0.65 % SOLN nasal spray Place 1 spray into both nostrils as needed for congestion. (Patient not taking: Reported on 08/01/2020)  . terbinafine (LAMISIL) 250 MG tablet  Take 1 tablet (250 mg total) by mouth daily. (Patient not taking: Reported on 08/01/2020)  . traZODone (DESYREL) 50 MG tablet TAKE 1/2 TO 1 TABLET(25 TO 50 MG) BY MOUTH AT BEDTIME AS NEEDED FOR SLEEP  . [DISCONTINUED] amoxicillin (AMOXIL) 875 MG tablet Take 1 tablet (875 mg total) by mouth 2 (two) times daily. (Patient not taking: Reported on 08/01/2020)   No facility-administered medications prior to visit.    Review of Systems  Constitutional: Positive for diaphoresis and fever. Negative for fatigue.  HENT: Positive for congestion, postnasal drip, rhinorrhea, sneezing and sore throat. Negative for sinus pressure, sinus pain and tinnitus.   Respiratory: Positive for cough. Negative for apnea, choking, chest tightness, shortness of breath and wheezing.   Cardiovascular: Negative.   Gastrointestinal: Negative.   Musculoskeletal: Negative.       Objective    There were no vitals taken for this visit.   Physical Exam    Patient is alert and oriented and responsive to questions Engages in conversation with provider. Speaks in full sentences without any pauses without any shortness of breath or distress.   Assessment & Plan     Sinus pressure  COVID - Plan: predniSONE (STERAPRED UNI-PAK 21 TAB) 10 MG (21) TBPK tablet  Meds ordered this encounter  Medications  . predniSONE (STERAPRED UNI-PAK 21 TAB) 10 MG (21) TBPK tablet    Sig: PO: Take 6 tablets on day 1:Take 5 tablets day 2:Take 4 tablets day 3: Take 3 tablets day 4:Take 2 tablets day five: 5 Take 1 tablet day 6    Dispense:  21 tablet    Refill:  0   Red Flags discussed. The patient was given clear instructions to go to ER or return to medical center if any red flags develop, symptoms do not improve, worsen or new problems develop. They verbalized understanding.   Return in about 4 days (around 03/17/2021), or if symptoms worsen or fail to improve, for at any time for any worsening symptoms, Go to Emergency room/ urgent care if  worse.     I discussed the assessment and treatment plan with the patient. The patient was provided an opportunity to ask questions and all were answered. The patient agreed with the plan and demonstrated an understanding of the instructions.   The patient was advised to call back or seek an in-person evaluation if the symptoms worsen or if the condition fails to improve as anticipated.  I provided 30 minutes of non-face-to-face time during this encounter.  The entirety of the information documented in the History of Present Illness, Review of Systems and Physical Exam were personally obtained by me. Portions of this information were initially documented by the CMA and reviewed by me for thoroughness and accuracy.    I discussed the limitations of evaluation and management by telemedicine and the availability of in person appointments. The patient expressed understanding and agreed to proceed.   Wellington Hampshire  [place], [place] [phone] (phone) [phone] (fax)  [place]

## 2021-04-02 ENCOUNTER — Telehealth (INDEPENDENT_AMBULATORY_CARE_PROVIDER_SITE_OTHER): Payer: BC Managed Care – PPO | Admitting: Family Medicine

## 2021-04-02 ENCOUNTER — Encounter: Payer: Self-pay | Admitting: Family Medicine

## 2021-04-02 ENCOUNTER — Telehealth: Payer: BC Managed Care – PPO | Admitting: Family Medicine

## 2021-04-02 DIAGNOSIS — U099 Post covid-19 condition, unspecified: Secondary | ICD-10-CM

## 2021-04-02 DIAGNOSIS — U071 COVID-19: Secondary | ICD-10-CM | POA: Diagnosis not present

## 2021-04-02 DIAGNOSIS — R5382 Chronic fatigue, unspecified: Secondary | ICD-10-CM | POA: Diagnosis not present

## 2021-04-02 DIAGNOSIS — G9332 Myalgic encephalomyelitis/chronic fatigue syndrome: Secondary | ICD-10-CM

## 2021-04-02 MED ORDER — PREDNISONE 10 MG (21) PO TBPK: ORAL_TABLET | ORAL | 0 refills | Status: DC

## 2021-04-02 NOTE — Progress Notes (Signed)
MyChart Video Visit    Virtual Visit via Video Note   This visit type was conducted due to national recommendations for restrictions regarding the COVID-19 Pandemic (e.g. social distancing) in an effort to limit this patient's exposure and mitigate transmission in our community. This patient is at least at moderate risk for complications without adequate follow up. This format is felt to be most appropriate for this patient at this time. Physical exam was limited by quality of the video and audio technology used for the visit.   Patient location: Home no family members present Provider location: Work office  I discussed the limitations of evaluation and management by telemedicine and the availability of in person appointments. The patient expressed understanding and agreed to proceed.  Patient: Norma Harris   DOB: Sep 06, 1981   40 y.o. Female  MRN: [id number] Visit Date: 04/02/2021  Today's healthcare provider: [name], [name]   Chief Complaint  Patient presents with  . URI   I,Norma Harris,acting as a Education administrator for [employer], [name].,have documented all relevant documentation on the behalf of Norma Quint Norma Marchena, [name],as directed by  Norma Quint Norma Duran, [name] while in the presence of Norma Quint Norma Panchal, [name].  Subjective    URI  This is a recurrent problem. The current episode started 1 to 4 weeks ago. The problem has been unchanged. There has been no fever. Associated symptoms include congestion, headaches, rhinorrhea and sinus pain. Pertinent negatives include no coughing, diarrhea, nausea, sore throat, vomiting or wheezing. She has tried increased fluids, decongestant and antihistamine for the symptoms. The treatment provided mild relief.  Patient reports she tested positive for Covid IN March,2022 and she is currently having the following symptoms above.    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 cetirizine (ZYRTEC) 10 MG tablet Take 10 mg by mouth daily.  Marland Kitchen escitalopram (LEXAPRO) 20 MG tablet TAKE 1 TABLET(20 MG) BY MOUTH DAILY  . ID NOW COVID-19 KIT See admin instructions. for testing  . levonorgestrel (MIRENA) 20 MCG/24HR IUD by Intrauterine route.  . metoprolol succinate (TOPROL-XL) 50 MG 24 hr tablet TAKE 1/2 TO 1 TABLET BY MOUTH EVERY DAY  . traZODone (DESYREL) 50 MG tablet TAKE 1/2 TO 1 TABLET(25 TO 50 MG) BY MOUTH AT BEDTIME AS NEEDED FOR SLEEP  . mupirocin cream (BACTROBAN) 2 % Apply 1 application topically 2 (two) times daily. (Patient not taking: No sig reported)  . sodium chloride (OCEAN) 0.65 % SOLN nasal spray Place 1 spray into both nostrils as needed for congestion. (Patient not taking: No sig reported)  . terbinafine (LAMISIL) 250 MG tablet Take 1 tablet (250 mg total) by mouth daily. (Patient not taking: No sig reported)  . [DISCONTINUED] predniSONE (STERAPRED UNI-PAK 21 TAB) 10 MG (21) TBPK tablet PO: Take 6 tablets on day 1:Take 5 tablets day 2:Take 4 tablets day 3: Take 3 tablets day 4:Take 2 tablets day five: 5 Take 1 tablet day 6 (Patient not taking: Reported on 04/02/2021)   No facility-administered medications prior to visit.    Review of Systems  Constitutional: Positive for fatigue. Negative for appetite change, chills and fever.  HENT: Positive for congestion, postnasal drip, rhinorrhea, sinus pressure and sinus pain. Negative for sore throat.   Respiratory: Negative for cough, chest tightness, shortness of breath and wheezing.   Gastrointestinal: Negative for diarrhea, nausea and vomiting.  Neurological: Positive for weakness and headaches.     Objective    There were no vitals taken for  this visit.  Physical Exam   Constitutional:      General: She is not in acute distress.    Appearance: Normal appearance. She is not ill-appearing.   Pulmonary:     Effort: Pulmonary effort is normal. No respiratory distress.  Neurological:     Mental Status: She is alert and oriented to person, place, and time.  Psychiatric:        Mood and Affect: Mood normal.        Behavior: Behavior normal.    Assessment & Plan     Problem List Items Addressed This Visit      Other   COVID   Relevant Medications   predniSONE (STERAPRED UNI-PAK 21 TAB) 10 MG (21) TBPK tablet    Other Visit Diagnoses    Post-COVID chronic fatigue    -  Primary     Plan Work note provided Will follow up for improvement Continue to rest as able If needed can refer to post-covid clinic if symptoms linger  Return in about 6 weeks (around 05/14/2021) for post-covid follow up.     I discussed the assessment and treatment plan with the patient. The patient was provided an opportunity to ask questions and all were answered. The patient agreed with the plan and demonstrated an understanding of the instructions.   The patient was advised to call back or seek an in-person evaluation if the symptoms worsen or if the condition fails to improve as anticipated.   [place], [place] [phone] (phone) [phone] (fax)  [place]

## 2021-04-02 NOTE — Patient Instructions (Signed)

## 2021-04-14 ENCOUNTER — Other Ambulatory Visit: Payer: Self-pay | Admitting: Family Medicine

## 2021-04-14 DIAGNOSIS — I10 Essential (primary) hypertension: Secondary | ICD-10-CM

## 2021-04-15 ENCOUNTER — Telehealth: Payer: Self-pay

## 2021-04-15 DIAGNOSIS — F419 Anxiety disorder, unspecified: Secondary | ICD-10-CM

## 2021-04-15 NOTE — Telephone Encounter (Signed)
[facility] faxed refill request for the following medications:   escitalopram (LEXAPRO) 20 MG tablet   Please advise.  

## 2021-04-15 NOTE — Telephone Encounter (Signed)
Requested Prescriptions  Pending Prescriptions Disp Refills  . metoprolol succinate (TOPROL-XL) 50 MG 24 hr tablet [Pharmacy Med Name: METOPROLOL ER SUCCINATE 50MG  TABS] 60 tablet 0    Sig: TAKE 1/2 TO 1 TABLET BY MOUTH EVERY DAY     Cardiovascular:  Beta Blockers Passed - 04/14/2021 10:59 PM      Passed - Last BP in normal range    BP Readings from Last 1 Encounters:  04/27/20 112/72         Passed - Last Heart Rate in normal range    Pulse Readings from Last 1 Encounters:  04/27/20 76         Passed - Valid encounter within last 6 months    Recent Outpatient Visits          1 week ago Post-COVID chronic fatigue   [facility], Laurita Quint, [name]   1 month ago Sinus pressure   [facility], Kelby Aline, [name]   8 months ago Insomnia, unspecified type   [place], PA-C   9 months ago Acute ear pain, right   [place], [name]   11 months ago Toenail fungus- left great toe    [facility] [place], Kelby Aline, [name]

## 2021-04-16 MED ORDER — ESCITALOPRAM OXALATE 20 MG PO TABS: 20.0000 mg | ORAL_TABLET | Freq: Every day | ORAL | 0 refills | Status: DC

## 2021-05-03 ENCOUNTER — Other Ambulatory Visit: Payer: Self-pay

## 2021-05-03 ENCOUNTER — Ambulatory Visit: Payer: BC Managed Care – PPO | Admitting: Podiatry

## 2021-05-03 DIAGNOSIS — L600 Ingrowing nail: Secondary | ICD-10-CM

## 2021-05-03 MED ORDER — AMOXICILLIN 500 MG PO CAPS: 500.0000 mg | ORAL_CAPSULE | Freq: Two times a day (BID) | ORAL | 0 refills | Status: DC

## 2021-05-03 NOTE — Progress Notes (Signed)
   Subjective: Patient presents today for evaluation of pain to the lateral border right great toe. Patient is concerned for possible ingrown nail.  She does have a history of ingrown toenail procedures to the lateral border of the left great toe which resolved 1 year ago when it was fixed with partial nail matricectomy.  Patient presents today for further treatment and evaluation.  Past Medical History:  Diagnosis Date  . Hypertension     Objective:  General: Well developed, nourished, in no acute distress, alert and oriented x3   Dermatology: Skin is warm, dry and supple bilateral.  Lateral border right great toe appears to be erythematous with evidence of an ingrowing nail. Pain on palpation noted to the border of the nail fold. The remaining nails appear unremarkable at this time. There are no open sores, lesions.  Vascular: Dorsalis Pedis artery and Posterior Tibial artery pedal pulses palpable. No lower extremity edema noted.   Neruologic: Grossly intact via light touch bilateral.  Musculoskeletal: Muscular strength within normal limits in all groups bilateral. Normal range of motion noted to all pedal and ankle joints.   Assesement: #1 Paronychia with ingrowing nail lateral border right great toe #2 Pain in toe   Plan of Care:  1. Patient evaluated.  2. Discussed treatment alternatives and plan of care. Explained nail avulsion procedure and post procedure course to patient. 3. Patient opted for permanent partial nail avulsion of the lateral border right great toe.  4. Prior to procedure, local anesthesia infiltration utilized using 3 ml of a 50:50 mixture of 2% plain lidocaine and 0.5% plain marcaine in a normal hallux block fashion and a betadine prep performed.  5. Partial permanent nail avulsion with chemical matrixectomy performed using [id number] applications of phenol followed by alcohol flush.  6. Light dressing applied. 7.  Patient has gentamicin cream at home.  Apply  daily  8.  Prescription for amoxicillin 500 mg 2 times daily #20  9.  Return to clinic 2 weeks.  Edrick Kins, DPM [facility]  Dr. Edrick Kins, DPM    [address], [place] [address]                Office [phone]  Fax [phone]

## 2021-05-17 ENCOUNTER — Other Ambulatory Visit: Payer: Self-pay

## 2021-05-17 DIAGNOSIS — G4700 Insomnia, unspecified: Secondary | ICD-10-CM

## 2021-05-17 MED ORDER — TRAZODONE HCL 50 MG PO TABS: ORAL_TABLET | ORAL | 3 refills | Status: DC

## 2021-05-17 NOTE — Telephone Encounter (Signed)
[facility] faxed refill request for the following medications:  traZODone (DESYREL) 50 MG tablet  Please advise.

## 2021-05-21 ENCOUNTER — Ambulatory Visit: Payer: BC Managed Care – PPO | Admitting: Podiatry

## 2021-05-21 DIAGNOSIS — S83209A Unspecified tear of unspecified meniscus, current injury, unspecified knee, initial encounter: Secondary | ICD-10-CM | POA: Insufficient documentation

## 2021-05-28 ENCOUNTER — Ambulatory Visit: Payer: BC Managed Care – PPO | Admitting: Podiatry

## 2021-05-31 DIAGNOSIS — Z9889 Other specified postprocedural states: Secondary | ICD-10-CM | POA: Insufficient documentation

## 2021-09-18 ENCOUNTER — Ambulatory Visit: Payer: BC Managed Care – PPO | Admitting: Family Medicine

## 2021-09-23 ENCOUNTER — Encounter: Payer: Self-pay | Admitting: Family Medicine

## 2021-09-23 ENCOUNTER — Ambulatory Visit: Payer: BC Managed Care – PPO | Admitting: Family Medicine

## 2021-09-23 ENCOUNTER — Other Ambulatory Visit: Payer: Self-pay

## 2021-09-23 VITALS — BP 116/65 | HR 63 | Temp 98.1°F | Resp 16 | Wt 213.2 lb

## 2021-09-23 DIAGNOSIS — F419 Anxiety disorder, unspecified: Secondary | ICD-10-CM

## 2021-09-23 DIAGNOSIS — Q872 Congenital malformation syndromes predominantly involving limbs: Secondary | ICD-10-CM | POA: Diagnosis not present

## 2021-09-23 DIAGNOSIS — Z975 Presence of (intrauterine) contraceptive device: Secondary | ICD-10-CM

## 2021-09-23 DIAGNOSIS — I10 Essential (primary) hypertension: Secondary | ICD-10-CM

## 2021-09-23 DIAGNOSIS — F418 Other specified anxiety disorders: Secondary | ICD-10-CM

## 2021-09-23 NOTE — Patient Instructions (Signed)
Call [facility] at [phone] for possible ADD evaluation.

## 2021-09-23 NOTE — Progress Notes (Signed)
Acute Office Visit  Subjective:    Patient ID: Norma Harris, female    DOB: 07-19-1981, 40 y.o.   MRN: [id number]  Chief Complaint  Patient presents with   Consult    Patient presents in office today with a form from [facility], patient needs medical clearance to donate plasma due to her having Klippel Trengunay syndrome.    ADHD    Patient would like to discuss concerns of possibly being ADD or ADHD. 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     HPI Patient is in today for approval to donate plasma.  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 She needs clearance to donate plasma.  She is having no issues. She does have a question about possible ADD as when she is trying to teach and her child is talking she will get distracted and lose her train of thought.   Past Medical History:  Diagnosis Date   Hypertension     Past Surgical History:  Procedure Laterality Date   HERNIA REPAIR     LAPAROSCOPIC OVARIAN     MENISCUS REPAIR     and ACL repai-has screws/staples-RIGHT   PILONIDAL CYST EXCISION     VEIN LIGATION AND STRIPPING     x 7    Family History  Problem Relation Age of Onset   Fibromyalgia Mother    Hyperlipidemia Father    Alzheimer's disease Maternal Grandmother    Lung cancer Paternal Grandfather    Diabetes Maternal Grandfather    Cancer Maternal Grandfather     Social History   Socioeconomic History   Marital status: Single    Spouse name: single   Number of children: 0   Years of education: 16   Highest education level: Not on file  Occupational History   Occupation: middle school history teacher  Tobacco Use   Smoking status: Never   Smokeless tobacco: Never  Vaping Use   Vaping Use: Never used  Substance and Sexual  Activity   Alcohol use: Yes    Comment: very rare   Drug use: No   Sexual activity: Never    Birth control/protection: None, I.U.D.  Other Topics Concern   Not on file  Social History Narrative   Not on file   Social Determinants of Health   Financial Resource Strain: Not on file  Food Insecurity: Not on file  Transportation Needs: Not on file  Physical Activity: Not on file  Stress: Not on file  Social Connections: Not on file  Intimate Partner Violence: Not on file    Outpatient Medications Prior to Visit  Medication Sig Dispense Refill   cetirizine (ZYRTEC) 10 MG tablet Take 10 mg by mouth daily.     escitalopram (LEXAPRO) 20 MG tablet Take 1 tablet (20 mg total) by mouth daily. 90 tablet 0   levonorgestrel (MIRENA) 20 MCG/24HR IUD by Intrauterine route.     metoprolol succinate (TOPROL-XL) 50 MG 24 hr tablet TAKE 1/2 TO 1 TABLET BY MOUTH EVERY DAY 60 tablet 0   sodium chloride (OCEAN) 0.65 % SOLN nasal spray Place 1 spray into both nostrils as needed for congestion. 88 mL 0   traZODone (DESYREL) 50 MG tablet TAKE 1/2 TO 1 TABLET(25 TO 50 MG) BY MOUTH AT BEDTIME  AS NEEDED FOR SLEEP 30 tablet 3   amoxicillin (AMOXIL) 500 MG capsule Take 1 capsule (500 mg total) by mouth 2 (two) times daily. 20 capsule 0   ID NOW COVID-19 KIT See admin instructions. for testing     mupirocin cream (BACTROBAN) 2 % Apply 1 application topically 2 (two) times daily. (Patient not taking: No sig reported) 15 g 0   predniSONE (STERAPRED UNI-PAK 21 TAB) 10 MG (21) TBPK tablet PO: Take 6 tablets on day 1:Take 5 tablets day 2:Take 4 tablets day 3: Take 3 tablets day 4:Take 2 tablets day five: 5 Take 1 tablet day 6 21 tablet 0   terbinafine (LAMISIL) 250 MG tablet Take 1 tablet (250 mg total) by mouth daily. (Patient not taking: No sig reported) 90 tablet 0   No facility-administered medications prior to visit.    Allergies  Allergen Reactions   Arnica Rash    Hives, blisters Hives, blisters Hives,  blisters    Duloxetine Other (See Comments)    Other reaction(s): Other (See Comments) Anger, suicidality Suicidal feelings Suicidal feelings    Duloxetine Hcl Other (See Comments)    Suicidal feelings   Pseudoephedrine     Other reaction(s): Irregular heart rate   Pseudoephedrine-Guaifenesin     Other reaction(s): Irregular Heart Rate    Review of Systems     Objective:    Physical Exam Vitals reviewed.  Constitutional:      General: She is not in acute distress.    Appearance: She is well-developed.  HENT:     Head: Normocephalic and atraumatic.     Right Ear: Hearing normal.     Left Ear: Hearing normal.     Nose: Nose normal.  Eyes:     General: Lids are normal. No scleral icterus.       Right eye: No discharge.        Left eye: No discharge.     Conjunctiva/sclera: Conjunctivae normal.  Cardiovascular:     Rate and Rhythm: Normal rate and regular rhythm.     Heart sounds: Normal heart sounds.  Pulmonary:     Effort: Pulmonary effort is normal. No respiratory distress.     Breath sounds: Normal breath sounds.  Skin:    Findings: No lesion or rash.  Neurological:     General: No focal deficit present.     Mental Status: She is alert and oriented to person, place, and time.  Psychiatric:        Mood and Affect: Mood normal.        Speech: Speech normal.        Behavior: Behavior normal.        Thought Content: Thought content normal.        Judgment: Judgment normal.    BP 116/65   Pulse 63   Temp 98.1 F (36.7 C) (Oral)   Resp 16   Wt 213 lb 3.2 oz (96.7 kg)   SpO2 100%   BMI 36.03 kg/m  Wt Readings from Last 3 Encounters:  09/23/21 213 lb 3.2 oz (96.7 kg)  04/27/20 204 lb 12.8 oz (92.9 kg)  03/28/20 203 lb 3.2 oz (92.2 kg)    Health Maintenance Due  Topic Date Due   HIV Screening  Never done   Hepatitis C Screening  Never done   PAP SMEAR-Modifier  Never done   COVID-19 Vaccine (3 - Booster for Pfizer series) 08/06/2020   INFLUENZA  VACCINE  07/15/2021    There are  no preventive care reminders to display for this patient.   Lab Results  Component Value Date   TSH 1.110 04/02/2018   Lab Results  Component Value Date   WBC 11.2 (H) 08/30/2019   HGB 12.9 08/30/2019   HCT 38.1 08/30/2019   MCV 89 08/30/2019   PLT 349 08/30/2019   Lab Results  Component Value Date   NA 139 04/02/2018   K 4.4 04/02/2018   CO2 21 04/02/2018   GLUCOSE 89 04/02/2018   BUN 10 04/02/2018   CREATININE 0.79 04/02/2018   BILITOT 0.3 04/02/2018   ALKPHOS 71 04/02/2018   AST 13 04/02/2018   ALT 15 04/02/2018   PROT 7.1 04/02/2018   ALBUMIN 4.4 04/02/2018   CALCIUM 9.6 04/02/2018   ANIONGAP 9 08/28/2015   No results found for: CHOL No results found for: HDL No results found for: LDLCALC No results found for: TRIG No results found for: CHOLHDL Lab Results  Component Value Date   HGBA1C 5.6 04/02/2018       Assessment & Plan:   Problem List Items Addressed This Visit   None  1. Klippel Trenaunay syndrome This should not have no effect on the patient's ability to donate plasma.  She is cleared for this.  2. IUD (intrauterine device) in place   3. Depression with anxiety Clinically stable on Lexapro  4. Primary hypertension Trolled on metoprolol  5. Anxiety Refer for evaluation for possible ADD.  No orders of the defined types were placed in this encounter.  I, Wilhemena Durie, MD, have reviewed all documentation for this visit. The documentation on 09/24/21 for the exam, diagnosis, procedures, and orders are all accurate and complete.   Minette Headland, CMA

## 2021-10-03 ENCOUNTER — Telehealth: Payer: Self-pay | Admitting: Physician Assistant

## 2021-10-03 DIAGNOSIS — G4700 Insomnia, unspecified: Secondary | ICD-10-CM

## 2021-10-03 MED ORDER — TRAZODONE HCL 50 MG PO TABS: ORAL_TABLET | ORAL | 3 refills | Status: DC

## 2021-10-03 NOTE — Telephone Encounter (Signed)
[facility] faxed refill request for the following medications:  traZODone (DESYREL) 50 MG tablet  Last Rx: 05/17/21 LOV: 09/23/21 Please advise. Thanks TNP

## 2021-10-03 NOTE — Telephone Encounter (Signed)
Please review request. KW 

## 2021-10-31 ENCOUNTER — Telehealth: Payer: Self-pay | Admitting: Physician Assistant

## 2021-10-31 NOTE — Telephone Encounter (Signed)
[facility] faxed refill request for the following medications:  escitalopram (LEXAPRO) 20 MG tablet   metoprolol succinate (TOPROL-XL) 50 MG 24 hr tablet   Please advise.

## 2021-11-01 ENCOUNTER — Other Ambulatory Visit: Payer: Self-pay | Admitting: Physician Assistant

## 2021-11-01 ENCOUNTER — Encounter: Payer: Self-pay | Admitting: Physician Assistant

## 2021-11-01 ENCOUNTER — Other Ambulatory Visit: Payer: Self-pay

## 2021-11-01 DIAGNOSIS — F419 Anxiety disorder, unspecified: Secondary | ICD-10-CM

## 2021-11-01 DIAGNOSIS — I10 Essential (primary) hypertension: Secondary | ICD-10-CM

## 2021-11-01 MED ORDER — METOPROLOL SUCCINATE ER 50 MG PO TB24: ORAL_TABLET | ORAL | 1 refills | Status: DC

## 2021-11-01 MED ORDER — ESCITALOPRAM OXALATE 20 MG PO TABS: 20.0000 mg | ORAL_TABLET | Freq: Every day | ORAL | 1 refills | Status: DC

## 2021-11-01 MED ORDER — METOPROLOL SUCCINATE ER 25 MG PO TB24: ORAL_TABLET | ORAL | 0 refills | Status: DC

## 2021-11-01 NOTE — Progress Notes (Signed)
Pt takes toprol xl 50 mg but 1/2 a tab with dinner  Changed to toprol xl 25 mg 1 tab with dinner

## 2021-11-01 NOTE — Telephone Encounter (Signed)
Okay I refilled but if she take 50 mg 1/2 tab with dinner, I just changed to 25 mg 1 tab with dinner. I will send a message in my chart to pt

## 2021-11-01 NOTE — Telephone Encounter (Signed)
Norma Harris at [employer] needs clarification on how many tablets she takes and specific dose of the Metoprolol CB#  [phone]

## 2022-02-01 ENCOUNTER — Other Ambulatory Visit: Payer: Self-pay | Admitting: Physician Assistant

## 2022-02-01 DIAGNOSIS — I10 Essential (primary) hypertension: Secondary | ICD-10-CM

## 2022-02-03 NOTE — Telephone Encounter (Signed)
Requested Prescriptions  Pending Prescriptions Disp Refills   metoprolol succinate (TOPROL-XL) 25 MG 24 hr tablet [Pharmacy Med Name: METOPROLOL ER SUCCINATE 25MG  TABS] 60 tablet 0    Sig: TAKE 1 TABLET BY MOUTH WITH DINNER     Cardiovascular:  Beta Blockers Passed - 02/01/2022 10:25 PM      Passed - Last BP in normal range    BP Readings from Last 1 Encounters:  09/23/21 116/65         Passed - Last Heart Rate in normal range    Pulse Readings from Last 1 Encounters:  09/23/21 63         Passed - Valid encounter within last 6 months    Recent Outpatient Visits          4 months ago [place] Jerrol Banana., MD   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

## 2022-02-08 ENCOUNTER — Telehealth: Payer: Self-pay | Admitting: Nurse Practitioner

## 2022-02-08 DIAGNOSIS — J0140 Acute pansinusitis, unspecified: Secondary | ICD-10-CM

## 2022-02-08 DIAGNOSIS — R051 Acute cough: Secondary | ICD-10-CM

## 2022-02-08 MED ORDER — PREDNISONE 20 MG PO TABS: 20.0000 mg | ORAL_TABLET | Freq: Two times a day (BID) | ORAL | 0 refills | Status: AC

## 2022-02-08 MED ORDER — BENZONATATE 100 MG PO CAPS: 100.0000 mg | ORAL_CAPSULE | Freq: Three times a day (TID) | ORAL | 0 refills | Status: DC | PRN

## 2022-02-08 MED ORDER — AMOXICILLIN-POT CLAVULANATE 875-125 MG PO TABS: 1.0000 | ORAL_TABLET | Freq: Two times a day (BID) | ORAL | 0 refills | Status: AC

## 2022-02-08 NOTE — Progress Notes (Signed)
Virtual Visit Consent   Norma Harris, you are scheduled for a virtual visit with a [facility] provider today.     Just as with appointments in the office, your consent must be obtained to participate.  Your consent will be active for this visit and any virtual visit you may have with one of our providers in the next 365 days.     If you have a MyChart account, a copy of this consent can be sent to you electronically.  All virtual visits are billed to your insurance company just like a traditional visit in the office.    As this is a virtual visit, video technology does not allow for your provider to perform a traditional examination.  This may limit your provider's ability to fully assess your condition.  If your provider identifies any concerns that need to be evaluated in person or the need to arrange testing (such as labs, EKG, etc.), we will make arrangements to do so.     Although advances in technology are sophisticated, we cannot ensure that it will always work on either your end or our end.  If the connection with a video visit is poor, the visit may have to be switched to a telephone visit.  With either a video or telephone visit, we are not always able to ensure that we have a secure connection.     I need to obtain your verbal consent now.   Are you willing to proceed with your visit today?    AMIL MOSEMAN has provided verbal consent on 02/08/2022 for a virtual visit (video or telephone).   Apolonio Schneiders, [name]   Date: 02/08/2022 10:19 AM   Virtual Visit via Video Note   I, Apolonio Schneiders, connected with  GEMINI BUNTE  ([id number], Aug 27, 1981) on 02/08/22 at 10:30 AM EST by a video-enabled telemedicine application and verified that I am speaking with the correct person using two identifiers.  Location: Patient: Virtual Visit Location Patient: Home Provider: Virtual Visit Location Provider: Home Office   I discussed the limitations of evaluation and management by telemedicine and  the availability of in person appointments. The patient expressed understanding and agreed to proceed.    History of Present Illness: Norma Harris is a 41 y.o. who identifies as a female who was assigned female at birth, and is being seen today with complaints of URI symptoms for the past 2 weeks. 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 She has not been able to recover from typical cold symptoms with OTC medications.   She has had a cough for 2+ weeks that is productive at times and dry at other times.   She coughs more at night, it is difficult to fall asleep at night.   She is also having pressure in her ears as well.   She does take Zyrtec daily.   She denies a history of asthma She has never needed an inhaler in the past She has needed prednisone in the past   She denies tobacco use.     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   Cardiac murmur, unspecified 06/14/2012   Varicose vein 06/14/2012    Allergies:  Allergies  Allergen Reactions   Arnica Rash    Hives, blisters Hives, blisters Hives, blisters    Duloxetine Other (See Comments)    Other reaction(s): Other (See Comments) Anger, suicidality Suicidal feelings Suicidal feelings    Duloxetine Hcl Other (See Comments)    Suicidal feelings    Pseudoephedrine     Other reaction(s): Irregular heart rate   Pseudoephedrine-Guaifenesin     Other reaction(s): Irregular Heart Rate   Medications:  Current Outpatient Medications:    cetirizine (ZYRTEC) 10 MG tablet, Take 10 mg by mouth daily., Disp: , Rfl:    escitalopram (LEXAPRO) 20 MG tablet, Take 1 tablet (20 mg total) by mouth daily., Disp: 90 tablet, Rfl: 1   levonorgestrel (MIRENA) 20 MCG/24HR IUD, by Intrauterine route., Disp: , Rfl:    metoprolol succinate (TOPROL-XL) 25 MG 24 hr tablet, TAKE 1 TABLET BY MOUTH WITH DINNER, Disp: 60 tablet, Rfl: 0   sodium chloride (OCEAN) 0.65 % SOLN nasal spray, Place 1 spray into both nostrils as needed for congestion., Disp: 88 mL, Rfl: 0   traZODone (DESYREL) 50 MG tablet, TAKE 1/2 TO 1 TABLET(25 TO 50 MG) BY MOUTH AT BEDTIME AS NEEDED FOR SLEEP, Disp: 30 tablet, Rfl: 3  Observations/Objective: Patient is well-developed, well-nourished in no acute distress.  Resting comfortably at home.  Head is normocephalic, atraumatic.  No labored breathing.  Speech is clear and coherent with logical content.  Patient is alert and oriented at baseline.    Assessment and Plan: 1. Acute non-recurrent pansinusitis  - amoxicillin-clavulanate (AUGMENTIN) 875-125 MG tablet; Take 1 tablet by mouth 2 (two) times daily for 7 days. Take with food  Dispense: 14 tablet; Refill: 0  2. Acute cough  - predniSONE (DELTASONE) 20 MG tablet; Take 1 tablet (20 mg total) by mouth 2 (two) times daily with a meal for 5 days.  Dispense: 10 tablet; Refill: 0 - benzonatate (TESSALON) 100 MG capsule; Take 1 capsule (100 mg total) by mouth 3 (three) times daily as needed for cough.  Dispense: 30 capsule; Refill: 0    Follow Up Instructions: I discussed the assessment and treatment plan with the patient. The patient was provided an opportunity to ask questions and all were answered. The patient agreed with the plan and demonstrated an understanding of the instructions.  A  copy of instructions were sent to the patient via MyChart unless otherwise noted below.    The patient was advised to call back or seek an in-person evaluation if the symptoms worsen or if the condition fails to improve as anticipated.  Time:  I spent 10 minutes with the patient via telehealth technology discussing the above problems/concerns.    Apolonio Schneiders, [name]

## 2022-02-11 ENCOUNTER — Other Ambulatory Visit: Payer: Self-pay | Admitting: Family Medicine

## 2022-02-11 DIAGNOSIS — G4700 Insomnia, unspecified: Secondary | ICD-10-CM

## 2022-02-12 NOTE — Telephone Encounter (Signed)
Requested Prescriptions  ?Pending Prescriptions Disp Refills  ?? traZODone (DESYREL) 50 MG tablet [Pharmacy Med Name: TRAZODONE 50MG  TABLETS] 90 tablet   ?  Sig: TAKE 1/2 TO 1 TABLET(25 TO 50 MG) BY MOUTH AT BEDTIME AS NEEDED FOR SLEEP  ?  ? Psychiatry: Antidepressants - Serotonin Modulator Passed - 02/11/2022 10:49 PM  ?  ?  Passed - Completed PHQ-2 or PHQ-9 in the last 360 days  ?  ?  Passed - Valid encounter within last 6 months  ?  Recent Outpatient Visits   ?      ? 4 months ago Klippel Trenaunay syndrome  ? [facility] Jerrol Banana., MD  ? 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 [facility], Laurita Quint, [name]  ? 11 months ago Sinus pressure  ? [place], [name]  ? 1 year ago Insomnia, unspecified type  ? [place], [place]  ? 1 year ago Acute ear pain, right  ? [place], [name]  ?  ?  ? ?  ?  ?  ? ?

## 2022-04-05 ENCOUNTER — Other Ambulatory Visit: Payer: Self-pay | Admitting: Physician Assistant

## 2022-04-05 DIAGNOSIS — I10 Essential (primary) hypertension: Secondary | ICD-10-CM

## 2022-05-06 ENCOUNTER — Encounter: Payer: Self-pay | Admitting: Podiatry

## 2022-05-06 ENCOUNTER — Ambulatory Visit: Payer: BC Managed Care – PPO | Admitting: Podiatry

## 2022-05-06 DIAGNOSIS — B351 Tinea unguium: Secondary | ICD-10-CM

## 2022-05-06 DIAGNOSIS — L600 Ingrowing nail: Secondary | ICD-10-CM

## 2022-05-06 MED ORDER — GENTAMICIN SULFATE 0.1 % EX CREA: 1.0000 "application " | TOPICAL_CREAM | Freq: Two times a day (BID) | CUTANEOUS | 1 refills | Status: DC

## 2022-05-06 MED ORDER — TERBINAFINE HCL 250 MG PO TABS: 250.0000 mg | ORAL_TABLET | Freq: Every day | ORAL | 0 refills | Status: DC

## 2022-05-06 MED ORDER — DOXYCYCLINE HYCLATE 100 MG PO TABS: 100.0000 mg | ORAL_TABLET | Freq: Two times a day (BID) | ORAL | 0 refills | Status: DC

## 2022-05-06 NOTE — Progress Notes (Signed)
Subjective: 41 y.o. female presenting today for multiple complaints to the bilateral toes.  First, the patient states that she has been experiencing toenail fungus to the left hallux nail plate for several years.  It is discolored and thick and she has tried topical antifungal with no improvement.  She denies a history of injury.  She is concerned for toenail fungus Also the patient's does have history of partial nail matricectomy's due to ingrown toenails.  Patient states that recently she went to a pedicure salon and they pulled a portion of the nail from the medial border of the right hallux nail plate and she subsequently developed an ingrown.  She would like to have it evaluated and treated.  She presents for further treatment and evaluation  Past Medical History:  Diagnosis Date   Hypertension    Past Surgical History:  Procedure Laterality Date   HERNIA REPAIR     LAPAROSCOPIC OVARIAN     MENISCUS REPAIR     and ACL repai-has screws/staples-RIGHT   PILONIDAL CYST EXCISION     VEIN LIGATION AND STRIPPING     x 7   Allergies  Allergen Reactions   Arnica Rash    Hives, blisters Hives, blisters Hives, blisters    Duloxetine Other (See Comments)    Other reaction(s): Other (See Comments) Anger, suicidality Suicidal feelings Suicidal feelings    Duloxetine Hcl Other (See Comments)    Suicidal feelings   Pseudoephedrine     Other reaction(s): Irregular heart rate   Pseudoephedrine-Guaifenesin     Other reaction(s): Irregular Heart Rate     Objective: Physical Exam General: The patient is alert and oriented x3 in no acute distress.  Dermatology: Hyperkeratotic, discolored, thickened, onychodystrophy noted left hallux nail plate. Skin is warm, dry and supple bilateral lower extremities. Negative for open lesions or macerations.  Right hallux nail plate medial border appears to be erythematous with evidence of an ingrowing nail. Pain on palpation noted to the border of  the nail fold.  Vascular: Palpable pedal pulses bilaterally.  Chronic right lower extremity lymphedema.  Neurological: Epicritic and protective threshold grossly intact bilaterally.   Musculoskeletal Exam: No pedal deformity noted  Assessment: #1 Onychomycosis of toenail left hallux nail plate #2 ingrown toenail right hallux medial border  Plan of Care:  #1 Patient was evaluated. #2  Today we discussed different treatment options including oral, topical, and laser antifungal treatment modalities.  We discussed their efficacies and side effects.  Patient opts for oral antifungal treatment modality #3 prescription for Lamisil 250 mg #90 daily.  She denies a history of liver pathology or symptoms.  Patient is otherwise healthy #4 Discussed treatment alternatives and plan of care regarding the ingrown toenail. Explained nail avulsion procedure and post procedure course to patient. #4 patient opted for permanent partial nail avulsion of the ingrown portion of the nail.  #5 prior to procedure, local anesthesia infiltration utilized using 74m of a 50:50 mixture of 2% plain lidocaine and 0.5% plain marcaine in a normal hallux block fashion and a betadine prep performed. partial permanent nail avulsion with chemical matrixectomy performed using [id number] of phenol followed by alcohol flush. Light dressing applied.  Post care instructions provided #6 prescription for gentamicin 2% cream  #7 prescription for doxycycline 100 mg 2 times daily #20 #8 return to clinic 2 weeks   BEdrick Kins DPM [facility]  Dr. BEdrick Kins DPM    [address]  [place], [place] [address]                Office [phone]  Fax [phone]

## 2022-05-16 ENCOUNTER — Other Ambulatory Visit: Payer: Self-pay | Admitting: Physician Assistant

## 2022-05-16 DIAGNOSIS — G4700 Insomnia, unspecified: Secondary | ICD-10-CM

## 2022-05-19 NOTE — Telephone Encounter (Signed)
Called pt and LMOM to return call and schedule appointment.

## 2022-05-19 NOTE — Telephone Encounter (Signed)
Courtesy refill Requested Prescriptions  Pending Prescriptions Disp Refills  . traZODone (DESYREL) 50 MG tablet [Pharmacy Med Name: TRAZODONE '50MG'$  TABLETS] 90 tablet 0    Sig: TAKE 1/2 TO 1 TABLET(25 TO 50 MG) BY MOUTH AT BEDTIME AS NEEDED FOR SLEEP     Psychiatry: Antidepressants - Serotonin Modulator Failed - 05/16/2022  4:49 PM      Failed - Valid encounter within last 6 months    Recent Outpatient Visits          7 months ago [place] Jerrol Banana., MD   1 year ago Post-COVID chronic fatigue   [facility], Laurita Quint, [name]   1 year ago Sinus pressure   [place] Flinchum, Kelby Aline, [name]   1 year ago Insomnia, unspecified type   [place], Wendee Beavers, PA-C   1 year ago Acute ear pain, right   [place], [name]             Passed - Completed PHQ-2 or PHQ-9 in the last 360 days

## 2022-05-27 ENCOUNTER — Ambulatory Visit: Payer: BC Managed Care – PPO | Admitting: Podiatry

## 2022-06-03 ENCOUNTER — Other Ambulatory Visit: Payer: Self-pay | Admitting: Physician Assistant

## 2022-06-03 DIAGNOSIS — I10 Essential (primary) hypertension: Secondary | ICD-10-CM

## 2022-06-19 ENCOUNTER — Other Ambulatory Visit: Payer: Self-pay | Admitting: Physician Assistant

## 2022-06-19 DIAGNOSIS — G4700 Insomnia, unspecified: Secondary | ICD-10-CM

## 2022-06-20 NOTE — Telephone Encounter (Signed)
Requested Prescriptions  Pending Prescriptions Disp Refills  . traZODone (DESYREL) 50 MG tablet [Pharmacy Med Name: TRAZODONE '50MG'$  TABLETS] 30 tablet 0    Sig: TAKE 1/2 TO 1 TABLET(25 TO 50 MG) BY MOUTH AT BEDTIME AS NEEDED FOR SLEEP     Psychiatry: Antidepressants - Serotonin Modulator Failed - 06/19/2022  4:12 PM      Failed - Valid encounter within last 6 months    Recent Outpatient Visits          9 months ago [place] Jerrol Banana., MD   1 year ago Post-COVID chronic fatigue   [facility], Laurita Quint, [name]   1 year ago Sinus pressure   [place] Flinch[facility], Kelby Aline, [name]   1 year ago Insomnia, unspecified type   [place], Wendee Beavers, [place]   2 years ago Acute ear pain, right   [place], [name]      Future Appointments            In 6 days Drubel, Ria Comment, PA-C [facility], [place] - Completed PHQ-2 or PHQ-9 in the last 360 days

## 2022-06-26 ENCOUNTER — Ambulatory Visit: Payer: BC Managed Care – PPO | Admitting: Physician Assistant

## 2022-07-21 NOTE — Progress Notes (Unsigned)
I,Sha'taria Tyson,acting as a Education administrator for [employer], PA-C.,have documented all relevant documentation on the behalf of [name], PA-C,as directed by  [name], PA-C while in the presence of [name], PA-C.   Established patient visit   Patient: Norma Harris   DOB: Sep 05, 1981   41 y.o. Female  MRN: [id number] Visit Date: 07/22/2022  Today's healthcare provider: [name], PA-C   No chief complaint on file.  Subjective    HPI  Hypertension, follow-up  BP Readings from Last 3 Encounters:  09/23/21 116/65  04/27/20 112/72  03/28/20 128/84   Wt Readings from Last 3 Encounters:  09/23/21 213 lb 3.2 oz (96.7 kg)  04/27/20 204 lb 12.8 oz (92.9 kg)  03/28/20 203 lb 3.2 oz (92.2 kg)     She was last seen for hypertension 10 months ago.  BP at that visit was 116/65. Management since that visit includes continue metoprolol.  She reports {excellent/good/fair/poor:19665} compliance with treatment. She {is/is not:9024} having side effects. {document side effects if present:1} She is following a {diet:21022986} diet. She {is/is not:9024} exercising. She {does/does not:200015} smoke.  Use of agents associated with hypertension: {bp agents assoc with hypertension:511::"none"}.   Outside blood pressures are {***enter patient reported home BP readings, or 'not being checked':1}. Symptoms: {Yes/No:20286} chest pain {Yes/No:20286} chest pressure  {Yes/No:20286} palpitations {Yes/No:20286} syncope  {Yes/No:20286} dyspnea {Yes/No:20286} orthopnea  {Yes/No:20286} paroxysmal nocturnal dyspnea {Yes/No:20286} lower extremity edema   Pertinent labs No results found for: "CHOL", "HDL", "LDLCALC", "LDLDIRECT", "TRIG", "CHOLHDL" Lab Results  Component Value Date   NA 139 04/02/2018   K 4.4 04/02/2018   CREATININE 0.79 04/02/2018   GFRNONAA 97 04/02/2018   GLUCOSE 89 04/02/2018   TSH 1.110 04/02/2018     The ASCVD Risk score (Arnett DK, et al., 2019) failed to  calculate for the following reasons:   Cannot find a previous HDL lab   Cannot find a previous total cholesterol lab  --------------------------------------------------------------------------------------------------- Anxiety, Follow-up  She was last seen for anxiety 10 months ago. Changes made at last visit include continue lexapro.   She reports {excellent/good/fair/poor:19665} compliance with treatment. She reports {good/fair/poor:18685} tolerance of treatment. She {is/is not:21021397} having side effects. {document side effects if present:1}  She feels her anxiety is {Desc; severity:60313} and {improved/worse/unchanged:3041574} since last visit.  Symptoms: {Yes/No:20286} chest pain {Yes/No:20286} difficulty concentrating  {Yes/No:20286} dizziness {Yes/No:20286} fatigue  {Yes/No:20286} feelings of losing control {Yes/No:20286} insomnia  {Yes/No:20286} irritable {Yes/No:20286} palpitations  {Yes/No:20286} panic attacks {Yes/No:20286} racing thoughts  {Yes/No:20286} shortness of breath {Yes/No:20286} sweating  {Yes/No:20286} tremors/shakes    GAD-7 Results     No data to display          PHQ-9 Scores    09/23/2021    2:44 PM 06/09/2017    3:10 PM 02/24/2017    5:29 PM  PHQ9 SCORE ONLY  PHQ-9 Total Score '5 13 8    '$ ---------------------------------------------------------------------------------------------------   Medications: Outpatient Medications Prior to Visit  Medication Sig   benzonatate (TESSALON) 100 MG capsule Take 1 capsule (100 mg total) by mouth 3 (three) times daily as needed for cough.   cetirizine (ZYRTEC) 10 MG tablet Take 10 mg by mouth daily.   doxycycline (VIBRA-TABS) 100 MG tablet Take 1 tablet (100 mg total) by mouth 2 (two) times daily.   escitalopram (LEXAPRO) 20 MG tablet Take 1 tablet (20 mg total) by mouth daily.   gentamicin cream (GARAMYCIN) 0.1 % Apply 1 application. topically 2 (two) times daily.   levonorgestrel (MIRENA) 20  MCG/24HR  IUD by Intrauterine route.   metoprolol succinate (TOPROL-XL) 25 MG 24 hr tablet TAKE 1 TABLET BY MOUTH WITH DINNER   sodium chloride (OCEAN) 0.65 % SOLN nasal spray Place 1 spray into both nostrils as needed for congestion.   terbinafine (LAMISIL) 250 MG tablet Take 1 tablet (250 mg total) by mouth daily.   traZODone (DESYREL) 50 MG tablet TAKE 1/2 TO 1 TABLET(25 TO 50 MG) BY MOUTH AT BEDTIME AS NEEDED FOR SLEEP   No facility-administered medications prior to visit.    Review of Systems  {Labs  Heme  Chem  Endocrine  Serology  Results Review (optional):23779}   Objective    There were no vitals taken for this visit. {Show previous vital signs (optional):23777}  Physical Exam  ***  No results found for any visits on 07/22/22.  Assessment & Plan     ***  No follow-ups on file.      {provider attestation***:1}   [name], PA-C  [facility] [phone] (phone) [phone] (fax)  [place]

## 2022-07-22 ENCOUNTER — Ambulatory Visit: Payer: BC Managed Care – PPO | Admitting: Physician Assistant

## 2022-07-22 ENCOUNTER — Encounter: Payer: Self-pay | Admitting: Physician Assistant

## 2022-07-22 VITALS — BP 127/66 | HR 78 | Ht 64.0 in | Wt 214.5 lb

## 2022-07-22 DIAGNOSIS — G4700 Insomnia, unspecified: Secondary | ICD-10-CM | POA: Diagnosis not present

## 2022-07-22 DIAGNOSIS — F419 Anxiety disorder, unspecified: Secondary | ICD-10-CM

## 2022-07-22 DIAGNOSIS — I10 Essential (primary) hypertension: Secondary | ICD-10-CM | POA: Diagnosis not present

## 2022-07-22 HISTORY — DX: Insomnia, unspecified: G47.00

## 2022-07-22 MED ORDER — METOPROLOL SUCCINATE ER 25 MG PO TB24: 25.0000 mg | ORAL_TABLET | Freq: Every day | ORAL | 1 refills | Status: DC

## 2022-07-22 MED ORDER — ESCITALOPRAM OXALATE 10 MG PO TABS: 10.0000 mg | ORAL_TABLET | Freq: Every day | ORAL | 1 refills | Status: DC

## 2022-07-22 MED ORDER — TRAZODONE HCL 50 MG PO TABS: 50.0000 mg | ORAL_TABLET | Freq: Every day | ORAL | 1 refills | Status: DC

## 2022-07-22 NOTE — Assessment & Plan Note (Signed)
Managed w/ metoprolol as pt also has a 'functional murmur'.  Very mild murmur heard today, bp in range.

## 2022-07-22 NOTE — Assessment & Plan Note (Signed)
Managed well with trazodone 50 mg qhs

## 2022-07-22 NOTE — Assessment & Plan Note (Signed)
Well controlled continue current medications, managed w/ lexapro 10 mg.

## 2022-07-23 LAB — COMPREHENSIVE METABOLIC PANEL
ALT: 11 IU/L (ref 0–32)
AST: 13 IU/L (ref 0–40)
Albumin/Globulin Ratio: 1.6 (ref 1.2–2.2)
Albumin: 4.2 g/dL (ref 3.9–4.9)
Alkaline Phosphatase: 77 IU/L (ref 44–121)
BUN/Creatinine Ratio: 13 (ref 9–23)
BUN: 11 mg/dL (ref 6–24)
Bilirubin Total: 0.3 mg/dL (ref 0.0–1.2)
CO2: 20 mmol/L (ref 20–29)
Calcium: 9.6 mg/dL (ref 8.7–10.2)
Chloride: 103 mmol/L (ref 96–106)
Creatinine, Ser: 0.86 mg/dL (ref 0.57–1.00)
Globulin, Total: 2.7 g/dL (ref 1.5–4.5)
Glucose: 85 mg/dL (ref 70–99)
Potassium: 4.3 mmol/L (ref 3.5–5.2)
Sodium: 138 mmol/L (ref 134–144)
Total Protein: 6.9 g/dL (ref 6.0–8.5)
eGFR: 87 mL/min/{1.73_m2} (ref >59)

## 2022-07-23 LAB — CBC WITH DIFFERENTIAL/PLATELET
Basophils Absolute: 0 10*3/uL (ref 0.0–0.2)
Basos: 0 %
EOS (ABSOLUTE): 0.1 10*3/uL (ref 0.0–0.4)
Eos: 1 %
Hematocrit: 36.3 % (ref 34.0–46.6)
Hemoglobin: 12.4 g/dL (ref 11.1–15.9)
Immature Grans (Abs): 0 10*3/uL (ref 0.0–0.1)
Immature Granulocytes: 0 %
Lymphocytes Absolute: 2.5 10*3/uL (ref 0.7–3.1)
Lymphs: 28 %
MCH: 29.4 pg (ref 26.6–33.0)
MCHC: 34.2 g/dL (ref 31.5–35.7)
MCV: 86 fL (ref 79–97)
Monocytes Absolute: 0.7 10*3/uL (ref 0.1–0.9)
Monocytes: 8 %
Neutrophils Absolute: 5.6 10*3/uL (ref 1.4–7.0)
Neutrophils: 63 %
Platelets: 342 10*3/uL (ref 150–450)
RBC: 4.22 x10E6/uL (ref 3.77–5.28)
RDW: 12.7 % (ref 11.7–15.4)
WBC: 9 10*3/uL (ref 3.4–10.8)

## 2022-08-07 ENCOUNTER — Other Ambulatory Visit: Payer: Self-pay | Admitting: Physician Assistant

## 2022-08-07 DIAGNOSIS — F419 Anxiety disorder, unspecified: Secondary | ICD-10-CM

## 2022-11-19 ENCOUNTER — Telehealth: Payer: BC Managed Care – PPO | Admitting: Physician Assistant

## 2022-11-19 DIAGNOSIS — B9689 Other specified bacterial agents as the cause of diseases classified elsewhere: Secondary | ICD-10-CM

## 2022-11-19 DIAGNOSIS — J019 Acute sinusitis, unspecified: Secondary | ICD-10-CM

## 2022-11-19 MED ORDER — AMOXICILLIN-POT CLAVULANATE 875-125 MG PO TABS: 1.0000 | ORAL_TABLET | Freq: Two times a day (BID) | ORAL | 0 refills | Status: DC

## 2022-11-19 MED ORDER — BENZONATATE 100 MG PO CAPS: 100.0000 mg | ORAL_CAPSULE | Freq: Three times a day (TID) | ORAL | 0 refills | Status: DC | PRN

## 2022-11-19 NOTE — Progress Notes (Signed)
Virtual Visit Consent   Norma Harris, you are scheduled for a virtual visit with a [facility] provider today. Just as with appointments in the office, your consent must be obtained to participate. Your consent will be active for this visit and any virtual visit you may have with one of our providers in the next 365 days. If you have a MyChart account, a copy of this consent can be sent to you electronically.  As this is a virtual visit, video technology does not allow for your provider to perform a traditional examination. This may limit your provider's ability to fully assess your condition. If your provider identifies any concerns that need to be evaluated in person or the need to arrange testing (such as labs, EKG, etc.), we will make arrangements to do so. Although advances in technology are sophisticated, we cannot ensure that it will always work on either your end or our end. If the connection with a video visit is poor, the visit may have to be switched to a telephone visit. With either a video or telephone visit, we are not always able to ensure that we have a secure connection.  By engaging in this virtual visit, you consent to the provision of healthcare and authorize for your insurance to be billed (if applicable) for the services provided during this visit. Depending on your insurance coverage, you may receive a charge related to this service.  I need to obtain your verbal consent now. Are you willing to proceed with your visit today? [name] has provided verbal consent on 11/19/2022 for a virtual visit (video or telephone). Norma Harris, [place]  Date: 11/19/2022 11:12 AM  Virtual Visit via Video Note   I, Norma Harris, connected with  Norma Harris  ([id number], [birth year]) on 11/19/22 at 11:00 AM EST by a video-enabled telemedicine application and verified that I am speaking with the correct person using two identifiers.  Location: Patient: Virtual Visit Location  Patient: Home Provider: Virtual Visit Location Provider: Home Office   I discussed the limitations of evaluation and management by telemedicine and the availability of in person appointments. The patient expressed understanding and agreed to proceed.    History of Present Illness: Norma Harris is a 41 y.o. who identifies as a female who was assigned female at birth, and is being seen today for ongoing nasal and head congestion now with sinus pressure, sinus pain and productive cough. Symptoms present for 10+ days now and continue to progress. Denies fever, chest pain or SOB.  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   Cardiac murmur, unspecified 06/14/2012   Varicose vein 06/14/2012    Allergies:  Allergies  Allergen Reactions   Arnica Rash    Hives, blisters Hives, blisters Hives, blisters    Duloxetine Other (See Comments)    Other reaction(s): Other (See Comments)  Anger, suicidality Suicidal feelings Suicidal feelings    Duloxetine Hcl Other (See Comments)    Suicidal feelings   Pseudoephedrine     Other reaction(s): Irregular heart rate   Pseudoephedrine-Guaifenesin     Other reaction(s): Irregular Heart Rate   Medications:  Current Outpatient Medications:    amoxicillin-clavulanate (AUGMENTIN) 875-125 MG  tablet, Take 1 tablet by mouth 2 (two) times daily., Disp: 14 tablet, Rfl: 0   benzonatate (TESSALON) 100 MG capsule, Take 1 capsule (100 mg total) by mouth 3 (three) times daily as needed for cough., Disp: 30 capsule, Rfl: 0   rivaroxaban (XARELTO) 20 MG TABS tablet, Take by mouth., Disp: , Rfl:    cetirizine (ZYRTEC) 10 MG tablet, Take 10 mg by mouth daily., Disp: , Rfl:    escitalopram (LEXAPRO) 10 MG tablet, Take 1 tablet (10 mg total) by mouth daily., Disp: 90 tablet, Rfl: 1   gentamicin cream (GARAMYCIN) 0.1 %, Apply 1 application. topically 2 (two) times daily., Disp: 30 g, Rfl: 1   levonorgestrel (MIRENA) 20 MCG/DAY IUD, 1 each by Intrauterine route once., Disp: , Rfl:    metoprolol succinate (TOPROL-XL) 25 MG 24 hr tablet, Take 1 tablet (25 mg total) by mouth daily., Disp: 90 tablet, Rfl: 1   traZODone (DESYREL) 50 MG tablet, Take 1 tablet (50 mg total) by mouth at bedtime., Disp: 90 tablet, Rfl: 1  Observations/Objective: Patient is well-developed, well-nourished in no acute distress.  Resting comfortably at home.  Head is normocephalic, atraumatic.  No labored breathing. Speech is clear and coherent with logical content.  Patient is alert and oriented at baseline.   Assessment and Plan: 1. Acute bacterial sinusitis - benzonatate (TESSALON) 100 MG capsule; Take 1 capsule (100 mg total) by mouth 3 (three) times daily as needed for cough.  Dispense: 30 capsule; Refill: 0 - amoxicillin-clavulanate (AUGMENTIN) 875-125 MG tablet; Take 1 tablet by mouth 2 (two) times daily.  Dispense: 14 tablet; Refill: 0  Rx Augmentin.  Increase fluids.  Rest.  Saline nasal spray.  Probiotic.   Humidifier in bedroom. Tessalon per orders.  Call or return to clinic if symptoms are not improving.   Follow Up Instructions: I discussed the assessment and treatment plan with the patient. The patient was provided an opportunity to ask questions and all were answered. The patient agreed with the plan and  demonstrated an understanding of the instructions.  A copy of instructions were sent to the patient via MyChart unless otherwise noted below.   The patient was advised to call back or seek an in-person evaluation if the symptoms worsen or if the condition fails to improve as anticipated.  Time:  I spent 10 minutes with the patient via telehealth technology discussing the above problems/concerns.    Norma Rio, PA-C

## 2022-11-19 NOTE — Patient Instructions (Signed)
Norma Harris, thank you for joining [name], PA-C for today's virtual visit.  While this provider is not your primary care provider (PCP), if your PCP is located in our provider database this encounter information will be shared with them immediately following your visit.   [facility] account gives you access to today's visit and all your visits, tests, and labs performed at [facility] " click here if you don't have a [facility] account or go to [email or web address]  Consent: (Patient) Norma Harris provided verbal consent for this virtual visit at the beginning of the encounter.  Current Medications:  Current Outpatient Medications:    cetirizine (ZYRTEC) 10 MG tablet, Take 10 mg by mouth daily., Disp: , Rfl:    escitalopram (LEXAPRO) 10 MG tablet, Take 1 tablet (10 mg total) by mouth daily., Disp: 90 tablet, Rfl: 1   gentamicin cream (GARAMYCIN) 0.1 %, Apply 1 application. topically 2 (two) times daily., Disp: 30 g, Rfl: 1   levonorgestrel (MIRENA) 20 MCG/DAY IUD, 1 each by Intrauterine route once., Disp: , Rfl:    metoprolol succinate (TOPROL-XL) 25 MG 24 hr tablet, Take 1 tablet (25 mg total) by mouth daily., Disp: 90 tablet, Rfl: 1   traZODone (DESYREL) 50 MG tablet, Take 1 tablet (50 mg total) by mouth at bedtime., Disp: 90 tablet, Rfl: 1   Medications ordered in this encounter:  No orders of the defined types were placed in this encounter.    *If you need refills on other medications prior to your next appointment, please contact your pharmacy*  Follow-Up: Call back or seek an in-person evaluation if the symptoms worsen or if the condition fails to improve as anticipated.  [place] [phone]  Other Instructions Please take antibiotic as directed.  Increase fluid intake.  Use Saline nasal spray.  Take a daily multivitamin. Use the Tessalon as directed.  Place a humidifier in the bedroom.  Please call or  return clinic if symptoms are not improving.  Sinusitis Sinusitis is redness, soreness, and swelling (inflammation) of the paranasal sinuses. Paranasal sinuses are air pockets within the bones of your face (beneath the eyes, the middle of the forehead, or above the eyes). In healthy paranasal sinuses, mucus is able to drain out, and air is able to circulate through them by way of your nose. However, when your paranasal sinuses are inflamed, mucus and air can become trapped. This can allow bacteria and other germs to grow and cause infection. Sinusitis can develop quickly and last only a short time (acute) or continue over a long period (chronic). Sinusitis that lasts for more than 12 weeks is considered chronic.  CAUSES  Causes of sinusitis include: Allergies. Structural abnormalities, such as displacement of the cartilage that separates your nostrils (deviated septum), which can decrease the air flow through your nose and sinuses and affect sinus drainage. Functional abnormalities, such as when the small hairs (cilia) that line your sinuses and help remove mucus do not work properly or are not present. SYMPTOMS  Symptoms of acute and chronic sinusitis are the same. The primary symptoms are pain and pressure around the affected sinuses. Other symptoms include: Upper toothache. Earache. Headache. Bad breath. Decreased sense of smell and taste. A cough, which worsens when you are lying flat. Fatigue. Fever. Thick drainage from your nose, which often is green and may contain pus (purulent). Swelling and warmth over the affected sinuses. DIAGNOSIS  Your caregiver will perform a physical  exam. During the exam, your caregiver may: Look in your nose for signs of abnormal growths in your nostrils (nasal polyps). Tap over the affected sinus to check for signs of infection. View the inside of your sinuses (endoscopy) with a special imaging device with a light attached (endoscope), which is inserted  into your sinuses. If your caregiver suspects that you have chronic sinusitis, one or more of the following tests may be recommended: Allergy tests. Nasal culture A sample of mucus is taken from your nose and sent to a lab and screened for bacteria. Nasal cytology A sample of mucus is taken from your nose and examined by your caregiver to determine if your sinusitis is related to an allergy. TREATMENT  Most cases of acute sinusitis are related to a viral infection and will resolve on their own within 10 days. Sometimes medicines are prescribed to help relieve symptoms (pain medicine, decongestants, nasal steroid sprays, or saline sprays).  However, for sinusitis related to a bacterial infection, your caregiver will prescribe antibiotic medicines. These are medicines that will help kill the bacteria causing the infection.  Rarely, sinusitis is caused by a fungal infection. In theses cases, your caregiver will prescribe antifungal medicine. For some cases of chronic sinusitis, surgery is needed. Generally, these are cases in which sinusitis recurs more than 3 times per year, despite other treatments. HOME CARE INSTRUCTIONS  Drink plenty of water. Water helps thin the mucus so your sinuses can drain more easily. Use a humidifier. Inhale steam 3 to 4 times a day (for example, sit in the bathroom with the shower running). Apply a warm, moist washcloth to your face 3 to 4 times a day, or as directed by your caregiver. Use saline nasal sprays to help moisten and clean your sinuses. Take over-the-counter or prescription medicines for pain, discomfort, or fever only as directed by your caregiver. SEEK IMMEDIATE MEDICAL CARE IF: You have increasing pain or severe headaches. You have nausea, vomiting, or drowsiness. You have swelling around your face. You have vision problems. You have a stiff neck. You have difficulty breathing. MAKE SURE YOU:  Understand these instructions. Will watch your  condition. Will get help right away if you are not doing well or get worse. Document Released: 12/01/2005 Document Revised: 02/23/2012 Document Reviewed: 12/16/2011 [facility] Patient Information 2014 [place], [place].    If you have been instructed to have an in-person evaluation today at a local Urgent Care facility, please use the link below. It will take you to a list of all of our available [facility], including address, phone number and hours of operation. Please do not delay care.  [facility]  If you or a family member do not have a primary care provider, use the link below to schedule a visit and establish care. When you choose a [facility] primary care physician or advanced practice provider, you gain a long-term partner in health. Find a Primary Care Provider  Learn more about 's in-office and virtual care options: [facility]

## 2023-01-05 ENCOUNTER — Other Ambulatory Visit: Payer: Self-pay | Admitting: Physician Assistant

## 2023-01-05 DIAGNOSIS — G4700 Insomnia, unspecified: Secondary | ICD-10-CM

## 2023-01-22 ENCOUNTER — Ambulatory Visit: Payer: BC Managed Care – PPO | Admitting: Physician Assistant

## 2023-02-19 ENCOUNTER — Ambulatory Visit: Payer: BC Managed Care – PPO | Admitting: Physician Assistant

## 2023-02-19 VITALS — BP 132/73 | HR 77 | Wt 214.2 lb

## 2023-02-19 DIAGNOSIS — Z6836 Body mass index (BMI) 36.0-36.9, adult: Secondary | ICD-10-CM

## 2023-02-19 DIAGNOSIS — F418 Other specified anxiety disorders: Secondary | ICD-10-CM

## 2023-02-19 MED ORDER — BUPROPION HCL ER (XL) 150 MG PO TB24: 150.0000 mg | ORAL_TABLET | Freq: Every day | ORAL | 1 refills | Status: DC

## 2023-02-19 NOTE — Progress Notes (Signed)
I,Sha'taria Tyson,acting as a Education administrator for [employer], PA-C.,have documented all relevant documentation on the behalf of Norma Kirschner, PA-C,as directed by  Norma Kirschner, PA-C while in the presence of Norma Kirschner, PA-C.   Established patient visit   Patient: Norma Harris   DOB: June 02, 1981   42 y.o. Female  MRN: [id number] Visit Date: 02/19/2023  Today's healthcare provider: [name], PA-C   Cc. Anxiety/depression f/u  Subjective    HPI  Pt also has questions about weight loss today. Reports not being able to lose weight despite diet changes, and she cannot exercise much d/t her chronic conditions.  Anxiety, Follow-up  She was last seen for anxiety 7 months ago. Changes made at last visit include continue lexapro.  She feels her anxiety is moderate and Worse since last visit. Reports depression symptoms have worsened-- hopelessness; anger over where she is in life. She sees therapy weekly, has a good relationship with her therapist.  Symptoms: No chest pain No difficulty concentrating  No dizziness No fatigue  No feelings of losing control Yes insomnia  Yes irritable No palpitations  No panic attacks No racing thoughts  No shortness of breath No sweating  No tremors/shakes    GAD-7 Results    02/19/2023    4:03 PM 07/22/2022    2:37 PM  GAD-7 Generalized Anxiety Disorder Screening Tool  1. Feeling Nervous, Anxious, or on Edge 1 0  2. Not Being Able to Stop or Control Worrying 1 0  3. Worrying Too Much About Different Things 1 0  4. Trouble Relaxing 2 0  5. Being So Restless it's Hard To Sit Still 0 0  6. Becoming Easily Annoyed or Irritable 2 0  7. Feeling Afraid As If Something Awful Might Happen 0 0  Total GAD-7 Score 7 0  Difficulty At Work, Home, or Getting  Along With Others? Very difficult Not difficult at all   Depression, Follow-up Current symptoms include: depressed mood She feels she is Worse since last visit.     02/19/2023    4:04 PM  07/22/2022    2:40 PM 09/23/2021    2:44 PM  Depression screen PHQ 2/9  Decreased Interest 2 0 1  Down, Depressed, Hopeless 3 1 0  PHQ - 2 Score '5 1 1  '$ Altered sleeping 2 0 0  Tired, decreased energy '2 1 1  '$ Change in appetite '1 1 1  '$ Feeling bad or failure about yourself  '2 1 1  '$ Trouble concentrating 2 0 0  Moving slowly or fidgety/restless 0 0 1  Suicidal thoughts 1 0 0  PHQ-9 Score '15 4 5  '$ Difficult doing work/chores Very difficult Not difficult at all Very difficult  --------------------------------------------------------------------------------------------------   Medications: Outpatient Medications Prior to Visit  Medication Sig   cetirizine (ZYRTEC) 10 MG tablet Take 10 mg by mouth daily.   escitalopram (LEXAPRO) 10 MG tablet Take 1 tablet (10 mg total) by mouth daily.   levonorgestrel (MIRENA) 20 MCG/DAY IUD 1 each by Intrauterine route once.   metoprolol succinate (TOPROL-XL) 25 MG 24 hr tablet Take 1 tablet (25 mg total) by mouth daily.   rivaroxaban (XARELTO) 20 MG TABS tablet Take 20 mg by mouth daily with supper.   traZODone (DESYREL) 50 MG tablet TAKE 1 TABLET(50 MG) BY MOUTH AT BEDTIME   amoxicillin-clavulanate (AUGMENTIN) 875-125 MG tablet Take 1 tablet by mouth 2 (two) times daily. (Patient not taking: Reported on 02/19/2023)   benzonatate (TESSALON) 100 MG capsule Take  1 capsule (100 mg total) by mouth 3 (three) times daily as needed for cough. (Patient not taking: Reported on 02/19/2023)   gentamicin cream (GARAMYCIN) 0.1 % Apply 1 application. topically 2 (two) times daily. (Patient not taking: Reported on 02/19/2023)   No facility-administered medications prior to visit.    Review of Systems  Constitutional:  Negative for fatigue and fever.  Respiratory:  Negative for cough and shortness of breath.   Cardiovascular:  Negative for chest pain and leg swelling.  Gastrointestinal:  Negative for abdominal pain.  Neurological:  Negative for dizziness and headaches.   Psychiatric/Behavioral:  The patient is nervous/anxious.       Objective    BP 132/73 (BP Location: Left Arm, Patient Position: Sitting, Cuff Size: Large)   Pulse 77   Wt 214 lb 3.2 oz (97.2 kg)   SpO2 100%   BMI 36.77 kg/m    Physical Exam Vitals reviewed.  Constitutional:      Appearance: She is not ill-appearing.  HENT:     Head: Normocephalic.  Eyes:     Conjunctiva/sclera: Conjunctivae normal.  Cardiovascular:     Rate and Rhythm: Normal rate.  Pulmonary:     Effort: Pulmonary effort is normal. No respiratory distress.  Neurological:     General: No focal deficit present.     Mental Status: She is alert and oriented to person, place, and time.  Psychiatric:        Mood and Affect: Mood normal.        Behavior: Behavior normal.     No results found for any visits on 02/19/23.  Assessment & Plan     Problem List Items Addressed This Visit       Other   Depression with anxiety - Primary    Pt managed on lexapro 10 mg  Discussed adding wellbutrin vs increasing lexapro 10 mg dose. Given she desires weight loss, adding wellbutrin. Will monitor w/ metoprolol use.  Pt advised no etoh consumption. No seizure history Encouraged to continue with therapy F/u 4-6 weeks      Relevant Medications   buPROPion (WELLBUTRIN XL) 150 MG 24 hr tablet   Other Relevant Orders   Vitamin D (25 hydroxy)   Vitamin B12   TSH + free T4   BMI 36.0-36.9,adult    Discussed diet/exercise/medications        Return in about 4 weeks (around 03/19/2023) for depression.      I, Norma Kirschner, PA-C have reviewed all documentation for this visit. The documentation on  02/20/23  for the exam, diagnosis, procedures, and orders are all accurate and complete.  Norma Kirschner, PA-C [facility] [address] [place], [place], [address] Office: [phone] Fax: [place]

## 2023-02-20 ENCOUNTER — Encounter: Payer: Self-pay | Admitting: Physician Assistant

## 2023-02-20 DIAGNOSIS — Z6836 Body mass index (BMI) 36.0-36.9, adult: Secondary | ICD-10-CM | POA: Insufficient documentation

## 2023-02-20 NOTE — Assessment & Plan Note (Signed)
Discussed diet/exercise/medications

## 2023-02-20 NOTE — Assessment & Plan Note (Addendum)
Pt managed on lexapro 10 mg  Discussed adding wellbutrin vs increasing lexapro 10 mg dose. Given she desires weight loss, adding wellbutrin. Will monitor w/ metoprolol use.  Pt advised no etoh consumption. No seizure history Encouraged to continue with therapy F/u 4-6 weeks

## 2023-03-11 ENCOUNTER — Other Ambulatory Visit: Payer: Self-pay | Admitting: Physician Assistant

## 2023-03-11 DIAGNOSIS — I10 Essential (primary) hypertension: Secondary | ICD-10-CM

## 2023-03-24 ENCOUNTER — Encounter: Payer: Self-pay | Admitting: Physician Assistant

## 2023-03-24 ENCOUNTER — Ambulatory Visit: Payer: BC Managed Care – PPO | Admitting: Physician Assistant

## 2023-03-24 VITALS — BP 124/65 | HR 65 | Ht 64.0 in | Wt 212.0 lb

## 2023-03-24 DIAGNOSIS — F418 Other specified anxiety disorders: Secondary | ICD-10-CM | POA: Diagnosis not present

## 2023-03-24 NOTE — Progress Notes (Signed)
I,Sha'taria Tyson,acting as a Neurosurgeon for [employer], PA-C.,have documented all relevant documentation on the behalf of Alfredia Ferguson, PA-C,as directed by  Alfredia Ferguson, PA-C while in the presence of Alfredia Ferguson, PA-C.   Established patient visit   Patient: Norma Harris   DOB: May 12, 1981   42 y.o. Female  MRN: [id number] Visit Date: 03/24/2023  Today's healthcare provider: Alfredia Ferguson, PA-C   Cc. Depression/anxiety f/u  Subjective    HPI  Depression, Follow-up  She  was last seen for this 5 weeks ago. Changes made at last visit include managed on lexapro 10 mg. Given she desires weight loss, adding wellbutrin. Will monitor w/ metoprolol use.    She reports good compliance with treatment. She is not having side effects.   She reports good tolerance of treatment. Current symptoms include: none She feels she is Improved since last visit.     03/24/2023    4:04 PM 02/19/2023    4:04 PM 07/22/2022    2:40 PM  Depression screen PHQ 2/9  Decreased Interest 1 2 0  Down, Depressed, Hopeless 1 3 1   PHQ - 2 Score 2 5 1   Altered sleeping 0 2 0  Tired, decreased energy 1 2 1   Change in appetite 0 1 1  Feeling bad or failure about yourself  0 2 1  Trouble concentrating 0 2 0  Moving slowly or fidgety/restless 0 0 0  Suicidal thoughts 0 1 0  PHQ-9 Score 3 15 4   Difficult doing work/chores Not difficult at all Very difficult Not difficult at all    -----------------------------------------------------------------------------------------   Medications: Outpatient Medications Prior to Visit  Medication Sig   buPROPion (WELLBUTRIN XL) 150 MG 24 hr tablet Take 1 tablet (150 mg total) by mouth daily.   cetirizine (ZYRTEC) 10 MG tablet Take 10 mg by mouth daily.   escitalopram (LEXAPRO) 10 MG tablet Take 1 tablet (10 mg total) by mouth daily.   levonorgestrel (MIRENA) 20 MCG/DAY IUD 1 each by Intrauterine route once.   metoprolol succinate (TOPROL-XL) 25 MG 24 hr tablet  TAKE 1 TABLET(25 MG) BY MOUTH DAILY   rivaroxaban (XARELTO) 20 MG TABS tablet Take 20 mg by mouth daily with supper.   traZODone (DESYREL) 50 MG tablet TAKE 1 TABLET(50 MG) BY MOUTH AT BEDTIME   [DISCONTINUED] amoxicillin-clavulanate (AUGMENTIN) 875-125 MG tablet Take 1 tablet by mouth 2 (two) times daily. (Patient not taking: Reported on 02/19/2023)   [DISCONTINUED] benzonatate (TESSALON) 100 MG capsule Take 1 capsule (100 mg total) by mouth 3 (three) times daily as needed for cough.   [DISCONTINUED] gentamicin cream (GARAMYCIN) 0.1 % Apply 1 application. topically 2 (two) times daily.   No facility-administered medications prior to visit.   Review of Systems  Constitutional:  Negative for fatigue and fever.  Respiratory:  Negative for cough and shortness of breath.   Cardiovascular:  Negative for chest pain and leg swelling.  Gastrointestinal:  Negative for abdominal pain.  Neurological:  Negative for dizziness and headaches.      Objective    BP 124/65 (BP Location: Left Arm, Patient Position: Sitting, Cuff Size: Normal)   Pulse 65   Ht 5\' 4"  (1.626 m)   Wt 212 lb (96.2 kg)   SpO2 99%   BMI 36.39 kg/m   Physical Exam Vitals reviewed.  Constitutional:      Appearance: She is not ill-appearing.  HENT:     Head: Normocephalic.  Eyes:     Conjunctiva/sclera: Conjunctivae normal.  Cardiovascular:     Rate and Rhythm: Normal rate.  Pulmonary:     Effort: Pulmonary effort is normal. No respiratory distress.  Neurological:     General: No focal deficit present.     Mental Status: She is alert and oriented to person, place, and time.  Psychiatric:        Mood and Affect: Mood normal.        Behavior: Behavior normal.      No results found for any visits on 03/24/23.  Assessment & Plan     Problem List Items Addressed This Visit       Other   Depression with anxiety - Primary    Now managed with lexapro 10 mg and wellbutrin 150 mg  Pt feels improved, phq9 improved.   Advised monitoring, can always increase wellbutrin dose if needed F/u prn patient, 3-6 mo         Return in about 6 months (around 09/23/2023) for chronic conditions.     I, Alfredia Ferguson, PA-C have reviewed all documentation for this visit. The documentation on  03/24/23  for the exam, diagnosis, procedures, and orders are all accurate and complete.  Alfredia Ferguson, PA-C [facility] [address] [place], [place], [address] Office: [phone] Fax: [phone]   [facility]

## 2023-03-24 NOTE — Assessment & Plan Note (Signed)
Now managed with lexapro 10 mg and wellbutrin 150 mg  Pt feels improved, phq9 improved.  Advised monitoring, can always increase wellbutrin dose if needed F/u prn patient, 3-6 mo

## 2023-03-26 ENCOUNTER — Encounter: Payer: BC Managed Care – PPO | Admitting: Obstetrics & Gynecology

## 2023-04-29 DIAGNOSIS — S93601A Unspecified sprain of right foot, initial encounter: Secondary | ICD-10-CM | POA: Insufficient documentation

## 2023-04-29 DIAGNOSIS — M79671 Pain in right foot: Secondary | ICD-10-CM | POA: Insufficient documentation

## 2023-05-04 ENCOUNTER — Other Ambulatory Visit: Payer: Self-pay | Admitting: Physician Assistant

## 2023-05-04 DIAGNOSIS — F419 Anxiety disorder, unspecified: Secondary | ICD-10-CM

## 2023-05-26 ENCOUNTER — Encounter: Payer: Self-pay | Admitting: Nurse Practitioner

## 2023-05-26 ENCOUNTER — Telehealth: Payer: BC Managed Care – PPO | Admitting: Nurse Practitioner

## 2023-05-26 DIAGNOSIS — H6690 Otitis media, unspecified, unspecified ear: Secondary | ICD-10-CM | POA: Diagnosis not present

## 2023-05-26 DIAGNOSIS — J309 Allergic rhinitis, unspecified: Secondary | ICD-10-CM

## 2023-05-26 MED ORDER — AMOXICILLIN 875 MG PO TABS
875.0000 mg | ORAL_TABLET | Freq: Two times a day (BID) | ORAL | 0 refills | Status: AC
Start: 2023-05-26 — End: 2023-06-05

## 2023-05-26 MED ORDER — FLUTICASONE PROPIONATE 50 MCG/ACT NA SUSP
2.0000 | Freq: Every day | NASAL | 6 refills | Status: DC
Start: 2023-05-26 — End: 2023-09-08

## 2023-05-26 NOTE — Progress Notes (Signed)
Virtual Visit Consent   Norma Harris, you are scheduled for a virtual visit with a [facility] provider today. Just as with appointments in the office, your consent must be obtained to participate. Your consent will be active for this visit and any virtual visit you may have with one of our providers in the next 365 days. If you have a MyChart account, a copy of this consent can be sent to you electronically.  As this is a virtual visit, video technology does not allow for your provider to perform a traditional examination. This may limit your provider's ability to fully assess your condition. If your provider identifies any concerns that need to be evaluated in person or the need to arrange testing (such as labs, EKG, etc.), we will make arrangements to do so. Although advances in technology are sophisticated, we cannot ensure that it will always work on either your end or our end. If the connection with a video visit is poor, the visit may have to be switched to a telephone visit. With either a video or telephone visit, we are not always able to ensure that we have a secure connection.  By engaging in this virtual visit, you consent to the provision of healthcare and authorize for your insurance to be billed (if applicable) for the services provided during this visit. Depending on your insurance coverage, you may receive a charge related to this service.  I need to obtain your verbal consent now. Are you willing to proceed with your visit today? Norma Harris has provided verbal consent on 05/26/2023 for a virtual visit (video or telephone). Viviano Simas, [name]  Date: 05/26/2023 4:09 PM  Virtual Visit via Video Note   I, Viviano Simas, connected with  Norma Harris  ([id number], 01-Nov-1981) on 05/26/23 at  4:15 PM EDT by a video-enabled telemedicine application and verified that I am speaking with the correct person using two identifiers.  Location: Patient: Virtual Visit Location Patient:  Home Provider: Virtual Visit Location Provider: Home Office   I discussed the limitations of evaluation and management by telemedicine and the availability of in person appointments. The patient expressed understanding and agreed to proceed.    History of Present Illness: Norma Harris is a 42 y.o. who identifies as a female who was assigned female at birth, and is being seen today for congestion, cough and   Symptom onset of cough was 05/18/23 this seemed like it was from PND, she did have a loss of voice associated with that that has resolved   Started to have improvement over the past weekend and then for the past 3 days she has had more sinus congestion and left ear pain that started today   She has not been taking anything OTC  She does take Zyrtec year round  Denies recent swimming   No fevers   The cough persists and seems to be more induced through activity at this point   Problems:  Patient Active Problem List   Diagnosis Date Noted   BMI 36.0-36.9,adult 02/20/2023   Insomnia 07/22/2022   Sinus pressure 03/13/2021   COVID 03/13/2021   Acute ear pain, right 06/20/2020   Upper respiratory infection, acute 06/20/2020   IUD (intrauterine device) in place 04/27/2020   Cyst of ovary 04/27/2020   Depression with anxiety 02/24/2017   Endometriosis 06/07/2015   Fatigue 06/07/2015   Essential hypertension 06/07/2015   Anxiety 11/20/2014   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   Varicose vein 06/14/2012    Allergies:  Allergies  Allergen Reactions   Arnica Rash    Hives, blisters Hives, blisters Hives, blisters    Duloxetine Other (See Comments)    Other reaction(s): Other (See Comments) Anger, suicidality Suicidal feelings Suicidal feelings    Duloxetine Hcl Other (See Comments)     Suicidal feelings   Pseudoephedrine     Other reaction(s): Irregular heart rate   Pseudoephedrine-Guaifenesin     Other reaction(s): Irregular Heart Rate   Medications:  Current Outpatient Medications:    buPROPion (WELLBUTRIN XL) 150 MG 24 hr tablet, Take 1 tablet (150 mg total) by mouth daily., Disp: 90 tablet, Rfl: 1   cetirizine (ZYRTEC) 10 MG tablet, Take 10 mg by mouth daily., Disp: , Rfl:    escitalopram (LEXAPRO) 10 MG tablet, TAKE 1 TABLET(10 MG) BY MOUTH DAILY, Disp: 90 tablet, Rfl: 3   levonorgestrel (MIRENA) 20 MCG/DAY IUD, 1 each by Intrauterine route once., Disp: , Rfl:    metoprolol succinate (TOPROL-XL) 25 MG 24 hr tablet, TAKE 1 TABLET(25 MG) BY MOUTH DAILY, Disp: 90 tablet, Rfl: 1   rivaroxaban (XARELTO) 20 MG TABS tablet, Take 20 mg by mouth daily with supper., Disp: , Rfl:    traZODone (DESYREL) 50 MG tablet, TAKE 1 TABLET(50 MG) BY MOUTH AT BEDTIME, Disp: 90 tablet, Rfl: 1  Observations/Objective: Patient is well-developed, well-nourished in no acute distress.  Resting comfortably  at home.  Head is normocephalic, atraumatic.  No labored breathing.  Speech is clear and coherent with logical content.  Patient is alert and oriented at baseline.    Assessment and Plan: 1. Allergic rhinitis, unspecified seasonality, unspecified trigger  - fluticasone (FLONASE) 50 MCG/ACT nasal spray; Place 2 sprays into both nostrils daily.  Dispense: 16 g; Refill: 6  2. Acute otitis media, unspecified otitis media type  - amoxicillin (AMOXIL) 875 MG tablet; Take 1 tablet (875 mg total) by mouth 2 (two) times daily for 10 days.  Dispense: 20 tablet; Refill: 0     Follow Up Instructions: I discussed the assessment and treatment plan with the patient. The patient was provided an opportunity to ask questions and all were answered. The patient agreed with the plan and demonstrated an understanding of the instructions.  A copy of instructions were sent to the patient via MyChart  unless otherwise noted below.    The patient was advised to call back or seek an in-person evaluation if the symptoms worsen or if the condition fails to improve as anticipated.  Time:  I spent 15 minutes with the patient via telehealth technology discussing the above problems/concerns.    Viviano Simas, [name]

## 2023-07-12 ENCOUNTER — Other Ambulatory Visit: Payer: Self-pay | Admitting: Physician Assistant

## 2023-07-12 DIAGNOSIS — G4700 Insomnia, unspecified: Secondary | ICD-10-CM

## 2023-08-07 DIAGNOSIS — M25552 Pain in left hip: Secondary | ICD-10-CM | POA: Insufficient documentation

## 2023-08-07 DIAGNOSIS — M7062 Trochanteric bursitis, left hip: Secondary | ICD-10-CM | POA: Insufficient documentation

## 2023-09-01 ENCOUNTER — Other Ambulatory Visit: Payer: Self-pay | Admitting: Physician Assistant

## 2023-09-01 DIAGNOSIS — F418 Other specified anxiety disorders: Secondary | ICD-10-CM

## 2023-09-08 ENCOUNTER — Ambulatory Visit (INDEPENDENT_AMBULATORY_CARE_PROVIDER_SITE_OTHER): Payer: BC Managed Care – PPO | Admitting: Family Medicine

## 2023-09-08 ENCOUNTER — Encounter: Payer: Self-pay | Admitting: Family Medicine

## 2023-09-08 ENCOUNTER — Ambulatory Visit: Payer: BC Managed Care – PPO

## 2023-09-08 VITALS — BP 124/78 | HR 70 | Temp 98.3°F | Resp 18 | Ht 64.0 in | Wt 203.7 lb

## 2023-09-08 DIAGNOSIS — N926 Irregular menstruation, unspecified: Secondary | ICD-10-CM | POA: Diagnosis not present

## 2023-09-08 DIAGNOSIS — Z7689 Persons encountering health services in other specified circumstances: Secondary | ICD-10-CM

## 2023-09-08 DIAGNOSIS — F418 Other specified anxiety disorders: Secondary | ICD-10-CM | POA: Diagnosis not present

## 2023-09-08 DIAGNOSIS — Q872 Congenital malformation syndromes predominantly involving limbs: Secondary | ICD-10-CM | POA: Diagnosis not present

## 2023-09-08 DIAGNOSIS — J329 Chronic sinusitis, unspecified: Secondary | ICD-10-CM

## 2023-09-08 MED ORDER — HYDROXYZINE HCL 10 MG PO TABS: 10.0000 mg | ORAL_TABLET | Freq: Three times a day (TID) | ORAL | 0 refills | Status: DC | PRN

## 2023-09-08 MED ORDER — AZITHROMYCIN 250 MG PO TABS
ORAL_TABLET | ORAL | 0 refills | Status: AC
Start: 2023-09-08 — End: 2023-09-13

## 2023-09-08 MED ORDER — ESCITALOPRAM OXALATE 20 MG PO TABS
20.0000 mg | ORAL_TABLET | Freq: Every day | ORAL | 1 refills | Status: DC
Start: 2023-09-08 — End: 2023-10-05

## 2023-09-08 NOTE — Progress Notes (Signed)
New Patient Office Visit  Subjective    Patient ID: Norma Harris, female    DOB: 1981-01-07  Age: 42 y.o. MRN: [id number]  CC:  Chief Complaint  Patient presents with   Establish Care    Patient is here to establish care with new PCP, she states Thursday after she washed her hair she leaned done to get something and when she sat up she states that  what she believes was csf was running out of her nose that was not mucus she states that she has had this before but did not go to see a doctor    HPI Norma Harris presents to establish care. Pt is new to me. Moved from [place] to [place].   Pt reports last Thursday, she took a shower and while leaning over; she had clear watery liquid come from her left side. She also reports vertigo that occurred also in this time period. She reports she laid down after this to see if this helped. She says this isn't the first time that this has happened. She reports it occurred a few months ago and lasted less than 12 hours. She reports a dull headache over the weekend but not bad to the point she needed to take anything. Saturday is the last day she reports liquid coming from her nose. She does reports hx of allergies and uses Zyrtec 10 mg at bedtime. She does report hx of recurrent sinus infections.   She has hx of Klippel Trenaunay Syndrome (KTS). She is seeing Dr Hyacinth Meeker Venous Malformation provider. She continues to have pain in the right leg. She has had sclerotherapy and vein stripping since 2010.   She is being referred to see OB/GYN soon for irregular menses. She has IUD placed 4 years ago.  She has hx of endometriosis in 2010 with surgery that involved the left ovary and tube removal. She still has her right ovary.  She reports she's been on Lexapro 10 mg daily. She has increased anxiety with her stressors and conditions. She reports panic attacks during these stressors.    09/08/2023    1:58 PM 02/19/2023    4:03 PM 07/22/2022    2:37 PM  GAD  7 : Generalized Anxiety Score  Nervous, Anxious, on Edge 1 1 0  Control/stop worrying 2 1 0  Worry too much - different things 2 1 0  Trouble relaxing 2 2 0  Restless 2 0 0  Easily annoyed or irritable 2 2 0  Afraid - awful might happen 2 0 0  Total GAD 7 Score 13 7 0  Anxiety Difficulty Very difficult Very difficult Not difficult at all    .  Outpatient Encounter Medications as of 09/08/2023  Medication Sig   aspirin EC 81 MG tablet Take 81 mg by mouth daily. Swallow whole.   cetirizine (ZYRTEC) 10 MG tablet Take 10 mg by mouth daily.   escitalopram (LEXAPRO) 10 MG tablet TAKE 1 TABLET(10 MG) BY MOUTH DAILY   levonorgestrel (MIRENA) 20 MCG/DAY IUD 1 each by Intrauterine route once.   metoprolol succinate (TOPROL-XL) 25 MG 24 hr tablet TAKE 1 TABLET(25 MG) BY MOUTH DAILY   oxyCODONE (OXY IR/ROXICODONE) 5 MG immediate release tablet Take 5 mg by mouth every 6 (six) hours as needed.   oxyCODONE-acetaminophen (PERCOCET/ROXICET) 5-325 MG tablet Take by mouth every 4 (four) hours as needed for severe pain.   traZODone (DESYREL) 50 MG tablet TAKE 1 TABLET(50 MG) BY MOUTH AT BEDTIME  buPROPion (WELLBUTRIN XL) 150 MG 24 hr tablet TAKE 1 TABLET(150 MG) BY MOUTH DAILY (Patient not taking: Reported on 09/08/2023)   Docusate Sodium (DSS) 100 MG CAPS Take 1 capsule by mouth 2 (two) times daily.   fluticasone (FLONASE) 50 MCG/ACT nasal spray Place 2 sprays into both nostrils daily. (Patient not taking: Reported on 09/08/2023)   methocarbamol (ROBAXIN) 500 MG tablet Take 500 mg by mouth 2 (two) times daily. (Patient not taking: Reported on 09/08/2023)   rivaroxaban (XARELTO) 20 MG TABS tablet Take 20 mg by mouth daily with supper. (Patient not taking: Reported on 09/08/2023)   No facility-administered encounter medications on file as of 09/08/2023.    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    Past Surgical History:  Procedure Laterality Date   HERNIA REPAIR     LAPAROSCOPIC OVARIAN     MENISCUS REPAIR     and ACL repai-has screws/staples-RIGHT   PILONIDAL CYST EXCISION     VEIN LIGATION AND STRIPPING     x 7    Family History  Problem Relation Age of Onset   Fibromyalgia Mother    Hyperlipidemia Father    Alzheimer's disease Maternal Grandmother    Lung cancer Paternal Grandfather    Diabetes Maternal Grandfather    Cancer Maternal Grandfather     Social History   Socioeconomic History   Marital status: Single    Spouse name: single   Number of children: 0   Years of education: 16   Highest education level: Not on file  Occupational History   Occupation: middle school history teacher  Tobacco Use   Smoking status: Never   Smokeless tobacco: Never  Vaping Use   Vaping status: Never Used  Substance and Sexual Activity   Alcohol use: Yes    Comment: occasionally   Drug use: No   Sexual activity: Never    Birth control/protection: None, I.U.D.  Other Topics Concern   Not on file  Social History Narrative   Not on file   Social Determinants of Health   Financial Resource Strain: Not on file  Food Insecurity: Not on file  Transportation Needs: Not on file  Physical Activity: Not on file  Stress: Not on file  Social Connections: Not on file  Intimate Partner Violence: Not on file    Review of Systems  HENT:  Positive for sinus pain.   Musculoskeletal:  Positive for joint pain.  Endo/Heme/Allergies:        Irregular menses  Psychiatric/Behavioral:  Positive for depression. The patient is nervous/anxious.   All other systems reviewed and are negative.        Objective    BP 124/78   Pulse 70   Temp 98.3 F (36.8 C) (Oral)   Resp 18   Ht 5\' 4"  (1.626 m)   Wt 203 lb 11.2 oz (92.4 kg)   SpO2 99%   BMI 34.97 kg/m   Physical Exam Vitals and nursing note reviewed.  Constitutional:      Appearance: Normal appearance. She is normal weight.  HENT:     Head: Normocephalic and atraumatic.     Right Ear: Tympanic membrane,  ear canal and external ear normal.     Left Ear: Tympanic membrane, ear canal and external ear normal.     Nose: No rhinorrhea.     Comments: Deviated septum with boggy right nasal turbinate    Mouth/Throat:     Mouth: Mucous membranes are moist.     Pharynx: Oropharynx is clear.  Eyes:     Conjunctiva/sclera: Conjunctivae normal.     Pupils: Pupils are equal, round, and reactive to light.  Cardiovascular:     Rate and Rhythm: Normal rate and regular rhythm.     Pulses: Normal pulses.     Heart sounds: Normal heart sounds.  Pulmonary:     Effort: Pulmonary effort is normal.     Breath sounds: Normal breath sounds.  Abdominal:     General: Abdomen is flat. Bowel sounds are normal.  Musculoskeletal:     Right lower leg: Edema present.  Skin:    General: Skin is warm.     Capillary Refill: Capillary refill takes less than 2 seconds.  Neurological:     General: No focal deficit present.     Mental Status: She is alert and oriented to person, place, and time. Mental status is at baseline.  Psychiatric:        Mood and Affect: Mood normal.        Behavior: Behavior normal.        Thought Content: Thought content normal.        Judgment: Judgment normal.       Assessment & Plan:   Problem List Items Addressed This Visit   None Encounter to establish care with new doctor  Klippel Trenaunay syndrome  Depression with anxiety -     Escitalopram Oxalate; Take 1 tablet (20 mg total) by mouth daily.  Dispense: 30 tablet; Refill: 1 -     hydrOXYzine HCl; Take 1 tablet (10 mg total) by mouth 3 (three) times  daily as needed.  Dispense: 30 tablet; Refill: 0  Recurrent sinus infections -     Azithromycin; Take 2 tablets on day 1, then 1 tablet daily on days 2 through 5  Dispense: 6 tablet; Refill: 0 -     DG Sinuses Complete; Future  Irregular menses   To continue routine follow up with Venous malformation provider on treatment of KTS. To see OB/GYN for evaluation of irregular menses. For clear drainage from left nostril and hx of allergies and recurrent sinus infections, treat with Z pack and send for sinus imaging to rule out polyps, sinus cysts etc. She is worried about CSF leak but no headaches reported and no red flag symptoms today to warrant MRI brain. Will monitor and follow up on imaging results for now. For worsening anxiety, increase the Lexapro from 10 mg to 20mg  daily and start trial of Hydroxyzine 10mg  TID prn for panic attacks. She is currently seeing a Veterinary surgeon. Will follow up in 4 weeks.    No follow-ups on file.   Suzan Slick, MD

## 2023-09-15 ENCOUNTER — Encounter: Payer: Self-pay | Admitting: Family Medicine

## 2023-09-15 ENCOUNTER — Other Ambulatory Visit: Payer: Self-pay | Admitting: Physician Assistant

## 2023-09-15 DIAGNOSIS — I10 Essential (primary) hypertension: Secondary | ICD-10-CM

## 2023-09-16 ENCOUNTER — Telehealth: Payer: Self-pay

## 2023-09-16 NOTE — Telephone Encounter (Signed)
Noted and aware. Once they return, I'll send to pt on mychart.

## 2023-09-16 NOTE — Telephone Encounter (Signed)
Spoke with imaging and asked about results of xray and was told report should be available later on today, was told they would make sure results were expedited

## 2023-10-03 ENCOUNTER — Other Ambulatory Visit: Payer: Self-pay | Admitting: Family Medicine

## 2023-10-03 DIAGNOSIS — F418 Other specified anxiety disorders: Secondary | ICD-10-CM

## 2023-10-05 DIAGNOSIS — D219 Benign neoplasm of connective and other soft tissue, unspecified: Secondary | ICD-10-CM | POA: Insufficient documentation

## 2023-10-06 ENCOUNTER — Ambulatory Visit: Payer: BC Managed Care – PPO | Admitting: Family Medicine

## 2023-10-06 ENCOUNTER — Encounter: Payer: Self-pay | Admitting: Family Medicine

## 2023-10-06 VITALS — BP 135/77 | HR 73 | Temp 98.1°F | Resp 18 | Ht 64.0 in | Wt 207.3 lb

## 2023-10-06 DIAGNOSIS — G894 Chronic pain syndrome: Secondary | ICD-10-CM | POA: Diagnosis not present

## 2023-10-06 DIAGNOSIS — F418 Other specified anxiety disorders: Secondary | ICD-10-CM

## 2023-10-06 MED ORDER — GABAPENTIN 100 MG PO CAPS: 100.0000 mg | ORAL_CAPSULE | Freq: Two times a day (BID) | ORAL | 0 refills | Status: DC | PRN

## 2023-10-06 NOTE — Progress Notes (Signed)
Established Patient Office Visit  Subjective   Patient ID: Norma Harris, female    DOB: 06-11-1981  Age: 42 y.o. MRN: [id number]  Chief Complaint  Patient presents with   Pain    Patient is here for a 1 month follow  up for mood, she would also like to discuss medication management    HPI  Depression/anxiety Pt was seen 4 weeks ago. She noted increased anxiety. She continues to see her counselor. She was increased on her Lexapro from 10 to 20 mg. She hasn't been taking the Hydroxyzine because she was told by her OB that it interacts? She says she feels like it's life stressors that has her more anxious.   Chronic pain Pt has chronic pelvic pain that radiates around her back and down her left leg. She is taking Ibuprofen/tylenol combination pill during the day. She takes the percocet at home when she doesn't have to work. She was added on Tizanidine but it makes her feel 'high' so she only does this at night. She has upcoming appt with pain management but looking for something to help with pain control during the day. Pt went to ER yesterday due to the pain. All labs and urine reviewed today and was normal.  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  Review of Systems  Musculoskeletal:        Chronic pelvic pain  Psychiatric/Behavioral:  Positive for depression. The patient is nervous/anxious.   All other systems reviewed and are negative.    Objective:     BP 135/77   Pulse 73   Temp 98.1 F (36.7 C) (Oral)   Resp 18   Ht 5\' 4"  (1.626 m)   Wt 207 lb 4.8 oz (94 kg)   LMP 09/08/2023   SpO2 99%   BMI 35.58 kg/m  BP Readings from Last 3 Encounters:  10/06/23 135/77  09/08/23 124/78  03/24/23 124/65      Physical Exam Vitals and nursing note reviewed.  Constitutional:      Appearance: Normal appearance. She is normal weight.  HENT:     Head: Normocephalic and atraumatic.     Right Ear: External ear normal.     Left Ear: External ear normal.     Nose:  Nose normal.     Mouth/Throat:     Mouth: Mucous membranes are moist.     Pharynx: Oropharynx is clear.  Eyes:     Conjunctiva/sclera: Conjunctivae normal.     Pupils: Pupils are equal, round, and reactive to light.  Cardiovascular:     Rate and Rhythm: Normal rate.  Pulmonary:     Effort: Pulmonary effort is normal.  Skin:    General: Skin is warm.     Capillary Refill: Capillary refill takes less than 2 seconds.  Neurological:     General: No focal deficit present.     Mental Status: She is alert and oriented to person, place, and time. Mental status is at baseline.  Psychiatric:        Mood and Affect: Mood normal.        Behavior: Behavior normal.        Thought Content: Thought content normal.        Judgment: Judgment normal.    No results found for any visits on 10/06/23.  Last CBC Lab Results  Component Value Date   WBC 9.0 07/22/2022   HGB 12.4 07/22/2022   HCT 36.3 07/22/2022   MCV 86  07/22/2022   MCH 29.4 07/22/2022   RDW 12.7 07/22/2022   PLT 342 07/22/2022   Last metabolic panel Lab Results  Component Value Date   GLUCOSE 85 07/22/2022   NA 138 07/22/2022   K 4.3 07/22/2022   CL 103 07/22/2022   CO2 20 07/22/2022   BUN 11 07/22/2022   CREATININE 0.86 07/22/2022   EGFR 87 07/22/2022   CALCIUM 9.6 07/22/2022   PROT 6.9 07/22/2022   ALBUMIN 4.2 07/22/2022   LABGLOB 2.7 07/22/2022   AGRATIO 1.6 07/22/2022   BILITOT 0.3 07/22/2022   ALKPHOS 77 07/22/2022   AST 13 07/22/2022   ALT 11 07/22/2022   ANIONGAP 9 08/28/2015   Last lipids No results found for: "CHOL", "HDL", "LDLCALC", "LDLDIRECT", "TRIG", "CHOLHDL" Last hemoglobin A1c Lab Results  Component Value Date   HGBA1C 5.6 04/02/2018      The ASCVD Risk score (Arnett DK, et al., 2019) failed to calculate for the following reasons:   Cannot find a previous HDL lab   Cannot find a previous total cholesterol lab    Assessment & Plan:   Problem List Items Addressed This Visit    None Depression with anxiety  Chronic pain syndrome -     Gabapentin; Take 1 capsule (100 mg total) by mouth 2 (two) times daily as needed.  Dispense: 60 capsule; Refill: 0   To continue Lexapro 20mg  daily and Hydroxyzine 10mg  TID prn for depression/anxiety. For pain control, will add Gabapentin 100mg  BID prn for her to try until she sees pain management. May continue Ibuprofen/tylenol combination during the day.  No follow-ups on file.    Suzan Slick, MD

## 2023-10-07 ENCOUNTER — Telehealth: Payer: Self-pay | Admitting: Family Medicine

## 2023-10-07 NOTE — Telephone Encounter (Signed)
Pt had appt yesterday. 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 This has been completed and is in the completed box. Pt may come pick up.

## 2023-10-08 ENCOUNTER — Encounter: Payer: Self-pay | Admitting: Family Medicine

## 2023-10-08 NOTE — Telephone Encounter (Signed)
Patient arrived to pick up completed form. Copies made for scan and reference. Norma Harris

## 2023-12-14 ENCOUNTER — Ambulatory Visit: Payer: BC Managed Care – PPO | Admitting: Family Medicine

## 2023-12-14 ENCOUNTER — Encounter: Payer: Self-pay | Admitting: Family Medicine

## 2023-12-14 VITALS — BP 142/80 | HR 62 | Temp 98.0°F | Resp 18 | Ht 64.0 in | Wt 208.6 lb

## 2023-12-14 DIAGNOSIS — F418 Other specified anxiety disorders: Secondary | ICD-10-CM

## 2023-12-14 DIAGNOSIS — G4700 Insomnia, unspecified: Secondary | ICD-10-CM | POA: Diagnosis not present

## 2023-12-14 MED ORDER — TRAZODONE HCL 50 MG PO TABS: 100.0000 mg | ORAL_TABLET | Freq: Every evening | ORAL | Status: DC | PRN

## 2023-12-14 MED ORDER — HYDROXYZINE HCL 25 MG PO TABS: 25.0000 mg | ORAL_TABLET | Freq: Three times a day (TID) | ORAL | 0 refills | Status: DC | PRN

## 2023-12-14 NOTE — Progress Notes (Signed)
Established Patient Office Visit  Subjective   Patient ID: Norma Harris, female    DOB: 03/21/1981  Age: 42 y.o. MRN: [id number]  Chief Complaint  Patient presents with   Depression   Headache    Depression        Associated symptoms include insomnia and headaches. Headache  Associated symptoms include insomnia.   Depression/insomnia Pt has been on Lexapro 20mg  daily along with Hydroxyzine 10mg  TID prn for depression and anxiety. She is using Trazodone 50mg  at night for sleep which hasn't helped. She does report she is also seeing a counselor for the last 4 years. She says it's hard for her to get to sleep at night as her mind is racing. She has multiple stressors that she's dealing with right now. She reports she is having worsening headaches as well. She has tried tylenol/ibuprofen as needed for the headaches. She is unsure if she snores as she sleeps alone.     Review of Systems  Neurological:  Positive for headaches.  Psychiatric/Behavioral:  Positive for depression. The patient has insomnia.   All other systems reviewed and are negative.    Objective:     BP (!) 142/80   Pulse 62   Temp 98 F (36.7 C) (Oral)   Resp 18   Ht 5\' 4"  (1.626 m)   Wt 208 lb 9.6 oz (94.6 kg)   SpO2 98%   BMI 35.81 kg/m     Physical Exam Vitals and nursing note reviewed.  Constitutional:      Appearance: Normal appearance. She is normal weight.  HENT:     Head: Normocephalic and atraumatic.     Right Ear: External ear normal.     Left Ear: External ear normal.     Nose: Nose normal.     Mouth/Throat:     Mouth: Mucous membranes are moist.     Pharynx: Oropharynx is clear.  Eyes:     Conjunctiva/sclera: Conjunctivae normal.     Pupils: Pupils are equal, round, and reactive to light.  Cardiovascular:     Rate and Rhythm: Normal rate.  Pulmonary:     Effort: Pulmonary effort is normal.  Skin:    General: Skin is warm.     Capillary Refill: Capillary refill takes less than 2  seconds.  Neurological:     General: No focal deficit present.     Mental Status: She is alert and oriented to person, place, and time. Mental status is at baseline.  Psychiatric:        Mood and Affect: Mood normal.        Behavior: Behavior normal.        Thought Content: Thought content normal.        Judgment: Judgment normal.    No results found for any visits on 12/14/23.     The ASCVD Risk score (Arnett DK, et al., 2019) failed to calculate for the following reasons:   Cannot find a previous HDL lab   Cannot find a previous total cholesterol lab    Assessment & Plan:   Problem List Items Addressed This Visit   None  Depression with anxiety -     traZODone HCl; Take 2-3 tablets (100-150 mg total) by mouth at bedtime as needed for sleep. -     Ambulatory referral to Behavioral Health -     hydrOXYzine HCl; Take 1 tablet (25 mg total) by mouth 3 (three) times daily as needed.  Dispense: 90 tablet; Refill:  0  Insomnia, unspecified type -     traZODone HCl; Take 2-3 tablets (100-150 mg total) by mouth at bedtime as needed for sleep. -     Ambulatory referral to Behavioral Health   Pt with worsening depression/insomnia. [facility] to [facility] for [facility] health medication management. To increase the Trazodone to 100-150mg  at night for now and refill the Hydroxyzine to use prn for anxiety. Will increase to 25mg  TID prn from 10 mg TID. To continue the Lexapro 20mg  daily. She is worried about bipolar as it runs in her family and would like to be ruled out for this.   No follow-ups on file.    Suzan Slick, MD

## 2024-01-09 ENCOUNTER — Encounter: Payer: Self-pay | Admitting: Family Medicine

## 2024-01-09 DIAGNOSIS — F418 Other specified anxiety disorders: Secondary | ICD-10-CM

## 2024-01-09 DIAGNOSIS — G4700 Insomnia, unspecified: Secondary | ICD-10-CM

## 2024-01-10 ENCOUNTER — Other Ambulatory Visit: Payer: Self-pay | Admitting: Family Medicine

## 2024-01-10 DIAGNOSIS — F418 Other specified anxiety disorders: Secondary | ICD-10-CM

## 2024-01-11 MED ORDER — TRAZODONE HCL 50 MG PO TABS: 100.0000 mg | ORAL_TABLET | Freq: Every evening | ORAL | 0 refills | Status: DC | PRN

## 2024-03-03 ENCOUNTER — Ambulatory Visit: Payer: Self-pay | Admitting: Family Medicine

## 2024-03-03 ENCOUNTER — Encounter: Payer: Self-pay | Admitting: Family Medicine

## 2024-03-03 VITALS — BP 119/72 | HR 64 | Temp 98.2°F | Resp 18 | Ht 64.0 in | Wt 222.2 lb

## 2024-03-03 DIAGNOSIS — Q872 Congenital malformation syndromes predominantly involving limbs: Secondary | ICD-10-CM | POA: Diagnosis not present

## 2024-03-03 DIAGNOSIS — Z6838 Body mass index (BMI) 38.0-38.9, adult: Secondary | ICD-10-CM

## 2024-03-03 DIAGNOSIS — Z131 Encounter for screening for diabetes mellitus: Secondary | ICD-10-CM | POA: Diagnosis not present

## 2024-03-03 DIAGNOSIS — F418 Other specified anxiety disorders: Secondary | ICD-10-CM | POA: Diagnosis not present

## 2024-03-03 NOTE — Progress Notes (Signed)
 Established Patient Office Visit  Subjective   Patient ID: Norma Harris, female    DOB: 02-06-1981  Age: 43 y.o. MRN: [id number]  Chief Complaint  Patient presents with   Weight Loss    Patient is interested in starting something for weight loss    HPI  Weight management Pt reports it's hard to lose weight because she is unable to exercise like she would like with her condition; Klippel-Trenaunay SYndrome. She has tried dieting but has been unsuccessful. She has gained weight in a short period of time. She is interested in losing weight and would like to try a weight loss aide.   Review of Systems  Constitutional:        Weight gain  All other systems reviewed and are negative.     Objective:     BP 119/72   Pulse 64   Temp 98.2 F (36.8 C) (Oral)   Resp 18   Ht 5\' 4"  (1.626 m)   Wt 222 lb 3.2 oz (100.8 kg)   SpO2 97%   BMI 38.14 kg/m  BP Readings from Last 3 Encounters:  03/03/24 119/72  12/14/23 (!) 142/80  10/06/23 135/77      Physical Exam Vitals and nursing note reviewed.  Constitutional:      Appearance: Normal appearance. She is obese.  HENT:     Head: Normocephalic and atraumatic.     Right Ear: External ear normal.     Left Ear: External ear normal.  Cardiovascular:     Rate and Rhythm: Bradycardia present.  Pulmonary:     Effort: Pulmonary effort is normal.  Skin:    Capillary Refill: Capillary refill takes less than 2 seconds.  Neurological:     General: No focal deficit present.     Mental Status: She is alert and oriented to person, place, and time. Mental status is at baseline.  Psychiatric:        Mood and Affect: Mood normal.        Behavior: Behavior normal.        Thought Content: Thought content normal.        Judgment: Judgment normal.     No results found for any visits on 03/03/24.     The ASCVD Risk score (Arnett DK, et al., 2019) failed to calculate for the following reasons:   Cannot find a previous HDL lab   Cannot  find a previous total cholesterol lab    Assessment & Plan:   Problem List Items Addressed This Visit       Cardiovascular and Mediastinum   Klippel Trenaunay syndrome - Primary     Other   Depression with anxiety   Other Visit Diagnoses       BMI 38.0-38.9,adult       Relevant Orders   Hemoglobin A1c     Screening for diabetes mellitus       Relevant Orders   Hemoglobin A1c      Klippel Trenaunay syndrome  BMI 38.0-38.9,adult -     Hemoglobin A1c  Screening for diabetes mellitus -     Hemoglobin A1c  Depression with anxiety    No follow-ups on file.  Due to BMI 38, will screen for diabetes. Pending lab results, will determine weight loss aide options. Have discussed the options with pt including Adipex, Wellbutrin, Topiramate, GLP-1 injections. Pt is open to trying the GLP 1 injections due to her having a hx of depression/anxiety.   Suzan Slick, MD

## 2024-03-04 ENCOUNTER — Other Ambulatory Visit: Payer: Self-pay | Admitting: Family Medicine

## 2024-03-04 ENCOUNTER — Encounter: Payer: Self-pay | Admitting: Family Medicine

## 2024-03-04 DIAGNOSIS — R7303 Prediabetes: Secondary | ICD-10-CM

## 2024-03-04 DIAGNOSIS — I10 Essential (primary) hypertension: Secondary | ICD-10-CM

## 2024-03-04 DIAGNOSIS — Q872 Congenital malformation syndromes predominantly involving limbs: Secondary | ICD-10-CM

## 2024-03-04 DIAGNOSIS — Z6838 Body mass index (BMI) 38.0-38.9, adult: Secondary | ICD-10-CM

## 2024-03-04 LAB — HEMOGLOBIN A1C
Est. average glucose Bld gHb Est-mCnc: 120 mg/dL
Hgb A1c MFr Bld: 5.8 % — ABNORMAL HIGH (ref 4.8–5.6)

## 2024-03-04 MED ORDER — SEMAGLUTIDE(0.25 OR 0.5MG/DOS) 2 MG/3ML ~~LOC~~ SOPN: 0.2500 mg | PEN_INJECTOR | SUBCUTANEOUS | 1 refills | Status: DC

## 2024-03-07 ENCOUNTER — Telehealth: Payer: Self-pay

## 2024-03-07 ENCOUNTER — Encounter: Payer: Self-pay | Admitting: Family Medicine

## 2024-03-07 NOTE — Telephone Encounter (Signed)
error 

## 2024-03-08 ENCOUNTER — Other Ambulatory Visit (HOSPITAL_COMMUNITY): Payer: Self-pay

## 2024-03-08 ENCOUNTER — Telehealth: Payer: Self-pay

## 2024-03-10 NOTE — Telephone Encounter (Signed)
 Prior auth for: [facility] Determination: Pending Auth #: [id number] Valid from: n/a Patient notified via MyChart

## 2024-03-16 ENCOUNTER — Encounter: Payer: Self-pay | Admitting: Family Medicine

## 2024-03-16 DIAGNOSIS — J309 Allergic rhinitis, unspecified: Secondary | ICD-10-CM

## 2024-03-16 MED ORDER — LEVOCETIRIZINE DIHYDROCHLORIDE 5 MG PO TABS: 5.0000 mg | ORAL_TABLET | Freq: Every evening | ORAL | 1 refills | Status: DC

## 2024-03-17 ENCOUNTER — Encounter: Payer: Self-pay | Admitting: Family Medicine

## 2024-03-17 DIAGNOSIS — R7303 Prediabetes: Secondary | ICD-10-CM

## 2024-03-17 MED ORDER — METFORMIN HCL ER 500 MG PO TB24: 500.0000 mg | ORAL_TABLET | Freq: Every day | ORAL | 1 refills | Status: AC

## 2024-03-17 NOTE — Addendum Note (Signed)
 Addended by: Suzan Slick on: 03/17/2024 01:59 PM   Modules accepted: Orders

## 2024-03-21 ENCOUNTER — Encounter: Payer: Self-pay | Admitting: Family Medicine

## 2024-03-21 DIAGNOSIS — I10 Essential (primary) hypertension: Secondary | ICD-10-CM

## 2024-03-21 MED ORDER — METOPROLOL SUCCINATE ER 25 MG PO TB24: 25.0000 mg | ORAL_TABLET | Freq: Every day | ORAL | 1 refills | Status: DC

## 2024-03-21 NOTE — Addendum Note (Signed)
 Addended by: Suzan Slick on: 03/21/2024 04:03 PM   Modules accepted: Orders

## 2024-04-13 ENCOUNTER — Other Ambulatory Visit: Payer: Self-pay | Admitting: Family Medicine

## 2024-04-13 DIAGNOSIS — F418 Other specified anxiety disorders: Secondary | ICD-10-CM

## 2024-04-14 ENCOUNTER — Encounter: Payer: Self-pay | Admitting: Family Medicine

## 2024-04-14 ENCOUNTER — Ambulatory Visit: Admitting: Family Medicine

## 2024-04-14 VITALS — BP 125/77 | HR 67 | Temp 98.0°F | Resp 18 | Ht 64.0 in | Wt 219.1 lb

## 2024-04-14 DIAGNOSIS — B9689 Other specified bacterial agents as the cause of diseases classified elsewhere: Secondary | ICD-10-CM

## 2024-04-14 DIAGNOSIS — N760 Acute vaginitis: Secondary | ICD-10-CM

## 2024-04-14 MED ORDER — METRONIDAZOLE 0.75 % VA GEL: 1.0000 | Freq: Two times a day (BID) | VAGINAL | 0 refills | Status: DC

## 2024-04-14 NOTE — Progress Notes (Signed)
 Established Patient Office Visit  Subjective   Patient ID: Norma Harris, female    DOB: 1981-03-12  Age: 43 y.o. MRN: [id number]  Chief Complaint  Patient presents with   Vaginitis    Patient states that she believes that she could possibly have  BV, she states that her symptoms started 3  weeks ago with light cramping, vaginal discharge, and spotting.     HPI  Pt reports she had hysterectomy in Nov 2024 due to heavy menses and found to have adenomyosis. Went back for follow up in 6 weeks. She reported post op she had vaginal discharge and vaginal spotting. She had vaginal swab and reported she had BV. She was given Flagyl  and symptoms cleared. She reports these same symptoms have returned in the last 3 weeks. She has appt with Gyn in mid May. Here to start treatment before seeing Gyn.  Not sexually active.    Review of Systems  Genitourinary:        Vaginal discharge with spotting  All other systems reviewed and are negative.    Objective:     BP 125/77   Pulse 67   Temp 98 F (36.7 C) (Oral)   Resp 18   Ht 5\' 4"  (1.626 m)   Wt 219 lb 1.6 oz (99.4 kg)   SpO2 98%   BMI 37.61 kg/m  BP Readings from Last 3 Encounters:  04/14/24 125/77  03/03/24 119/72  12/14/23 (!) 142/80      Physical Exam Vitals and nursing note reviewed.  Constitutional:      Appearance: Normal appearance. She is normal weight.  HENT:     Head: Normocephalic and atraumatic.     Right Ear: External ear normal.     Left Ear: External ear normal.     Nose: Nose normal.     Mouth/Throat:     Mouth: Mucous membranes are moist.     Pharynx: Oropharynx is clear.  Eyes:     Conjunctiva/sclera: Conjunctivae normal.     Pupils: Pupils are equal, round, and reactive to light.  Cardiovascular:     Rate and Rhythm: Normal rate.  Pulmonary:     Effort: Pulmonary effort is normal.  Skin:    General: Skin is warm.     Capillary Refill: Capillary refill takes less than 2 seconds.  Neurological:      General: No focal deficit present.     Mental Status: She is alert and oriented to person, place, and time. Mental status is at baseline.  Psychiatric:        Mood and Affect: Mood normal.        Behavior: Behavior normal.        Thought Content: Thought content normal.        Judgment: Judgment normal.    No results found for any visits on 04/14/24.     The ASCVD Risk score (Arnett DK, et al., 2019) failed to calculate for the following reasons:   Cannot find a previous HDL lab   Cannot find a previous total cholesterol lab    Assessment & Plan:   Problem List Items Addressed This Visit   None  Bacterial vaginosis -     metroNIDAZOLE ; Place 1 Applicatorful vaginally 2 (two) times daily.  Dispense: 70 g; Refill: 0   Review of previous wet prep confirming BV. Similar symptoms at this time so will go ahead and treat for presumed BV with metrogel .  To see Gyn as scheduled. If  no better next week, will send in flagyl  tabs.  No follow-ups on file.    Manette Section, MD

## 2024-04-29 ENCOUNTER — Other Ambulatory Visit: Payer: Self-pay | Admitting: Family Medicine

## 2024-04-29 DIAGNOSIS — F418 Other specified anxiety disorders: Secondary | ICD-10-CM

## 2024-04-29 DIAGNOSIS — G4700 Insomnia, unspecified: Secondary | ICD-10-CM

## 2024-05-25 NOTE — Telephone Encounter (Signed)
 error

## 2024-07-19 ENCOUNTER — Encounter: Payer: Self-pay | Admitting: Family Medicine

## 2024-07-19 ENCOUNTER — Ambulatory Visit (INDEPENDENT_AMBULATORY_CARE_PROVIDER_SITE_OTHER): Admitting: Family Medicine

## 2024-07-19 VITALS — BP 139/83 | HR 69 | Temp 98.1°F | Resp 18 | Ht 64.0 in | Wt 222.7 lb

## 2024-07-19 DIAGNOSIS — R7302 Impaired glucose tolerance (oral): Secondary | ICD-10-CM

## 2024-07-19 DIAGNOSIS — Z1231 Encounter for screening mammogram for malignant neoplasm of breast: Secondary | ICD-10-CM

## 2024-07-19 DIAGNOSIS — Z0000 Encounter for general adult medical examination without abnormal findings: Secondary | ICD-10-CM | POA: Diagnosis not present

## 2024-07-19 DIAGNOSIS — Z13220 Encounter for screening for lipoid disorders: Secondary | ICD-10-CM

## 2024-07-19 DIAGNOSIS — Z136 Encounter for screening for cardiovascular disorders: Secondary | ICD-10-CM

## 2024-07-19 NOTE — Progress Notes (Signed)
 Complete physical exam  Patient: Norma Harris   DOB: Feb 03, 1981   43 y.o. Female  MRN: [id number]  Subjective:    Chief Complaint  Patient presents with   Annual Exam    Lourene Hoston Puller is a 43 y.o. female who presents today for a complete physical exam. She reports consuming a general diet. The patient does not participate in regular exercise at present. She generally feels well. She reports sleeping well. She does not have additional problems to discuss today.    Most recent fall risk assessment:    09/08/2023    1:58 PM  Fall Risk   Falls in the past year? 0  Number falls in past yr: 0  Injury with Fall? 0  Risk for fall due to : No Fall Risks  Follow up Falls evaluation completed     Most recent depression screenings:    09/08/2023    1:58 PM 03/24/2023    4:04 PM  PHQ 2/9 Scores  PHQ - 2 Score 2 2  PHQ- 9 Score 9 3    Vision:Not within last year   Patient Active Problem List   Diagnosis Date Noted   Fibroid 10/05/2023   Trochanteric bursitis of left hip 08/07/2023   Pain of left hip joint 08/07/2023   Sprain of right foot 04/29/2023   Pain in right foot 04/29/2023   BMI 36.0-36.9,adult 02/20/2023   Insomnia 07/22/2022   History of reconstruction of anterior cruciate ligament tear 05/31/2021   Tear of meniscus of knee 05/21/2021   IUD (intrauterine device) in place 04/27/2020   Cyst of ovary 04/27/2020   Depression with anxiety 02/24/2017   Endometriosis 06/07/2015   Fatigue 06/07/2015   Essential hypertension 06/07/2015   Anxiety 11/20/2014   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   Depression with anxiety 02/24/2017   Essential hypertension 06/07/2015    Hypertension    Insomnia 07/22/2022   IUD (intrauterine device) in place 04/27/2020   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   Past Surgical History:  Procedure Laterality Date   ABDOMINAL HYSTERECTOMY     only has right ovary   HERNIA REPAIR     MENISCUS REPAIR     and ACL repai-has screws/staples-RIGHT   PILONIDAL CYST EXCISION     SALPINGOOPHORECTOMY Left 2010   VEIN LIGATION AND STRIPPING     x 7   Social History   Socioeconomic History   Marital status: Single    Spouse name: single   Number of children: 0   Years of education: 16   Highest education level: Bachelor's degree (e.g., BA, AB, BS)  Occupational History   Occupation: middle school history teacher  Tobacco Use   Smoking status: Never   Smokeless tobacco: Never  Vaping Use   Vaping status: Never Used  Substance and Sexual Activity   Alcohol use: Yes    Comment: occasionally   Drug use: No   Sexual activity: Never    Birth control/protection: None, I.U.D.  Other Topics Concern   Not on file  Social History Narrative   Not on file   Social Drivers of Health   Financial Resource Strain: Low Risk  (  07/18/2024)   Overall Financial Resource Strain (CARDIA)    Difficulty of Paying Living Expenses: Not hard at all  Food Insecurity: No Food Insecurity (07/18/2024)   Hunger Vital Sign    Worried About Running Out of Food in the Last Year: Never true    Ran Out of Food in the Last Year: Never true  Transportation Needs: No Transportation Needs (07/18/2024)   PRAPARE - Administrator, Civil Service (Medical): No    Lack of Transportation (Non-Medical): No  Physical Activity: Insufficiently Active (07/18/2024)   Exercise Vital Sign    Days of Exercise per Week: 2 days    Minutes of Exercise per Session: 20 min  Stress: Stress Concern Present (07/18/2024)   [employer] of Occupational  Health - Occupational Stress Questionnaire    Feeling of Stress: Rather much  Social Connections: Unknown (07/18/2024)   Social Connection and Isolation Panel    Frequency of Communication with Friends and Family: More than three times a week    Frequency of Social Gatherings with Friends and Family: Once a week    Attends Religious Services: More than 4 times per year    Active Member of [employer] or Organizations: Not on file    Attends Banker Meetings: Not on file    Marital Status: Never married  Intimate Partner Violence: Not on file   Family History  Problem Relation Age of Onset   Fibromyalgia Mother    Hyperlipidemia Father    Alzheimer's disease Maternal Grandmother    Diabetes Maternal Grandfather    Cancer Maternal Grandfather    Lung cancer Paternal Grandfather    Allergies  Allergen Reactions   Arnica Rash    Hives, blisters Hives, blisters Hives, blisters    Arnica [place]  Other (See Comments) and Rash    Hives, blisters   Duloxetine Other (See Comments)    Other reaction(s): Other (See Comments) Anger, suicidality Suicidal feelings Suicidal feelings    Duloxetine Hcl Other (See Comments)    Suicidal feelings   Pseudoephedrine     Other reaction(s): Irregular heart rate   Pseudoephedrine-Guaifenesin      Other reaction(s): Irregular Heart Rate      Patient Care Team: Colette Torrence GRADE, MD as PCP - General (Family Medicine)   Outpatient Medications Prior to Visit  Medication Sig   aspirin EC 81 MG tablet Take 81 mg by mouth daily. Swallow whole.   Docusate Sodium (DSS) 100 MG CAPS Take 1 capsule by mouth 2 (two) times daily.   escitalopram  (LEXAPRO ) 20 MG tablet TAKE 1 TABLET(20 MG) BY MOUTH DAILY   gabapentin  (NEURONTIN ) 100 MG capsule Take 1 capsule (100 mg total) by mouth 2 (two) times daily as needed.   hydrOXYzine  (ATARAX ) 25 MG tablet Take 1 tablet (25 mg total) by mouth 3 (three) times daily as needed.   levocetirizine (XYZAL ) 5 MG  tablet Take 1 tablet (5 mg total) by mouth every evening.   levonorgestrel (MIRENA) 20 MCG/DAY IUD 1 each by Intrauterine route once.   metFORMIN  (GLUCOPHAGE -XR) 500 MG 24 hr tablet Take 1 tablet (500 mg total) by mouth daily with breakfast.   metoprolol  succinate (TOPROL -XL) 25 MG 24 hr tablet Take 1 tablet (25 mg total) by mouth daily.   metroNIDAZOLE  (METROGEL ) 0.75 % vaginal gel Place 1 Applicatorful vaginally 2 (two) times daily.   oxyCODONE (OXY IR/ROXICODONE) 5 MG immediate release tablet Take 5 mg by mouth every 6 (six) hours as needed.   oxyCODONE-acetaminophen (PERCOCET/ROXICET)  5-325 MG tablet Take by mouth every 4 (four) hours as needed for severe pain.   tiZANidine (ZANAFLEX) 4 MG capsule Take 4 mg by mouth at bedtime.   traZODone  (DESYREL ) 50 MG tablet TAKE 2 TABLETS(100 MG) BY MOUTH AT BEDTIME AS NEEDED FOR SLEEP   No facility-administered medications prior to visit.    Review of Systems  All other systems reviewed and are negative.         Objective:     BP 139/83   Pulse 69   Temp 98.1 F (36.7 C) (Oral)   Resp 18   Ht 5' 4 (1.626 m)   Wt 222 lb 11.2 oz (101 kg)   SpO2 99%   BMI 38.23 kg/m  BP Readings from Last 3 Encounters:  07/19/24 139/83  04/14/24 125/77  03/03/24 119/72      Physical Exam Vitals and nursing note reviewed.  Constitutional:      Appearance: Normal appearance. She is normal weight.  HENT:     Head: Normocephalic and atraumatic.     Right Ear: Tympanic membrane, ear canal and external ear normal.     Left Ear: Tympanic membrane, ear canal and external ear normal.     Nose: Nose normal.     Mouth/Throat:     Mouth: Mucous membranes are moist.     Pharynx: Oropharynx is clear.  Eyes:     Conjunctiva/sclera: Conjunctivae normal.     Pupils: Pupils are equal, round, and reactive to light.  Cardiovascular:     Rate and Rhythm: Normal rate and regular rhythm.     Pulses: Normal pulses.     Heart sounds: Normal heart sounds.   Pulmonary:     Effort: Pulmonary effort is normal.     Breath sounds: Normal breath sounds.  Abdominal:     General: Abdomen is flat. Bowel sounds are normal.  Skin:    General: Skin is warm.     Capillary Refill: Capillary refill takes less than 2 seconds.  Neurological:     General: No focal deficit present.     Mental Status: She is alert and oriented to person, place, and time. Mental status is at baseline.  Psychiatric:        Mood and Affect: Mood normal.        Behavior: Behavior normal.        Thought Content: Thought content normal.        Judgment: Judgment normal.     No results found for any visits on 07/19/24.     Assessment & Plan:    Routine Health Maintenance and Physical Exam  Immunization History  Administered Date(s) Administered   Hepatitis A 08/30/2009   Influenza,inj,Quad PF,6+ Mos 11/17/2017, 09/16/2018   PFIZER(Purple Top)SARS-COV-2 Vaccination 02/12/2020, 03/06/2020   Td 08/30/2009   Tdap 08/30/2009, 03/20/2016    Health Maintenance  Topic Date Due   HIV Screening  Never done   Hepatitis C Screening  Never done   Hepatitis B Vaccines (1 of 3 - 19+ 3-dose series) Never done   HPV VACCINES (1 - 3-dose SCDM series) Never done   COVID-19 Vaccine (3 - 2024-25 season) 08/16/2023   INFLUENZA VACCINE  07/15/2024   DTaP/Tdap/Td (4 - Td or Tdap) 03/20/2026   Cervical Cancer Screening (HPV/Pap Cotest)  10/21/2026   Meningococcal B Vaccine  Aged Out    Discussed health benefits of physical activity, and encouraged her to engage in regular exercise appropriate for her age and condition.  Problem  List Items Addressed This Visit   None  No follow-ups on file. Annual physical exam  Encounter for lipid screening for cardiovascular disease -     Lipid panel  Impaired glucose tolerance -     CBC with Differential/Platelet -     Comprehensive metabolic panel with GFR -     Hemoglobin A1c  Screening mammogram for breast cancer -     3D Screening  Mammogram, Left and Right; Future   Screening labs Refer for mammogram See in 6 months sooner prn.     Torrence CINDERELLA Barrier, MD

## 2024-07-20 ENCOUNTER — Ambulatory Visit: Payer: Self-pay | Admitting: Family Medicine

## 2024-07-20 LAB — COMPREHENSIVE METABOLIC PANEL WITH GFR
ALT: 16 IU/L (ref 0–32)
AST: 19 IU/L (ref 0–40)
Albumin: 4.2 g/dL (ref 3.9–4.9)
Alkaline Phosphatase: 73 IU/L (ref 44–121)
BUN/Creatinine Ratio: 17 (ref 9–23)
BUN: 15 mg/dL (ref 6–24)
Bilirubin Total: 0.3 mg/dL (ref 0.0–1.2)
CO2: 22 mmol/L (ref 20–29)
Calcium: 9.7 mg/dL (ref 8.7–10.2)
Chloride: 100 mmol/L (ref 96–106)
Creatinine, Ser: 0.9 mg/dL (ref 0.57–1.00)
Globulin, Total: 2.8 g/dL (ref 1.5–4.5)
Glucose: 89 mg/dL (ref 70–99)
Potassium: 4.2 mmol/L (ref 3.5–5.2)
Sodium: 137 mmol/L (ref 134–144)
Total Protein: 7 g/dL (ref 6.0–8.5)
eGFR: 81 mL/min/1.73 (ref >59)

## 2024-07-20 LAB — CBC WITH DIFFERENTIAL/PLATELET
Basophils Absolute: 0 x10E3/uL (ref 0.0–0.2)
Basos: 1 %
EOS (ABSOLUTE): 0.1 x10E3/uL (ref 0.0–0.4)
Eos: 1 %
Hematocrit: 38.4 % (ref 34.0–46.6)
Hemoglobin: 12.8 g/dL (ref 11.1–15.9)
Immature Grans (Abs): 0 x10E3/uL (ref 0.0–0.1)
Immature Granulocytes: 0 %
Lymphocytes Absolute: 2.5 x10E3/uL (ref 0.7–3.1)
Lymphs: 32 %
MCH: 29.8 pg (ref 26.6–33.0)
MCHC: 33.3 g/dL (ref 31.5–35.7)
MCV: 90 fL (ref 79–97)
Monocytes Absolute: 0.6 x10E3/uL (ref 0.1–0.9)
Monocytes: 8 %
Neutrophils Absolute: 4.6 x10E3/uL (ref 1.4–7.0)
Neutrophils: 57 %
Platelets: 324 x10E3/uL (ref 150–450)
RBC: 4.29 x10E6/uL (ref 3.77–5.28)
RDW: 13.6 % (ref 11.7–15.4)
WBC: 7.9 x10E3/uL (ref 3.4–10.8)

## 2024-07-20 LAB — LIPID PANEL
Chol/HDL Ratio: 6.9 ratio — ABNORMAL HIGH (ref 0.0–4.4)
Cholesterol, Total: 165 mg/dL (ref 100–199)
HDL: 24 mg/dL — ABNORMAL LOW (ref >39)
LDL Chol Calc (NIH): 77 mg/dL (ref 0–99)
Triglycerides: 401 mg/dL — ABNORMAL HIGH (ref 0–149)
VLDL Cholesterol Cal: 64 mg/dL — ABNORMAL HIGH (ref 5–40)

## 2024-07-20 LAB — HEMOGLOBIN A1C
Est. average glucose Bld gHb Est-mCnc: 123 mg/dL
Hgb A1c MFr Bld: 5.9 % — ABNORMAL HIGH (ref 4.8–5.6)

## 2024-07-21 ENCOUNTER — Telehealth (INDEPENDENT_AMBULATORY_CARE_PROVIDER_SITE_OTHER): Admitting: Family Medicine

## 2024-07-21 ENCOUNTER — Encounter: Payer: Self-pay | Admitting: Family Medicine

## 2024-07-21 DIAGNOSIS — N898 Other specified noninflammatory disorders of vagina: Secondary | ICD-10-CM

## 2024-07-21 DIAGNOSIS — K5900 Constipation, unspecified: Secondary | ICD-10-CM

## 2024-07-21 DIAGNOSIS — S31831A Laceration without foreign body of anus, initial encounter: Secondary | ICD-10-CM | POA: Diagnosis not present

## 2024-07-21 DIAGNOSIS — R102 Pelvic and perineal pain: Secondary | ICD-10-CM | POA: Diagnosis not present

## 2024-07-21 MED ORDER — HYDROCORTISONE (PERIANAL) 2.5 % EX CREA: 1.0000 | TOPICAL_CREAM | Freq: Two times a day (BID) | CUTANEOUS | 0 refills | Status: DC

## 2024-07-21 NOTE — Progress Notes (Signed)
 Virtual Visit via Video Note  I connected with Norma Harris on 07/21/24 at  8:10 AM EDT by a video enabled telemedicine application and verified that I am speaking with the correct person using two identifiers.  Location: Patient: Home in [place] Provider: Office in [place]   I discussed the limitations of evaluation and management by telemedicine and the availability of in person appointments. The patient expressed understanding and agreed to proceed.  History of Present Illness: Pt reports recurrent episodes of vaginal discharge with some blood along with pelvic pain since her hysterectomy in Nov 2024. She says the episodes can come and go on their own without treatment. Occurs every 1-2 weeks. They last for about a week. She was seen for this in May and given Metrogel  that pt reports worked to clear her symptoms. She says she has never been sexually active. She says this time has occurred for the last 3 weeks but has stopped since her making this appointment.  Pt also reports a change in some of her mental health medications. She has since had constipation and with straining, she believes she has an anal tear. She has had some blood when having a bowel movement.    Observations/Objective: Physical Exam Constitutional:      Appearance: She is obese.  HENT:     Head: Normocephalic and atraumatic.     Nose: Nose normal.     Mouth/Throat:     Mouth: Mucous membranes are moist.  Eyes:     Extraocular Movements: Extraocular movements intact.  Pulmonary:     Effort: Pulmonary effort is normal.  Neurological:     General: No focal deficit present.     Mental Status: She is alert and oriented to person, place, and time. Mental status is at baseline.  Psychiatric:        Mood and Affect: Mood normal.        Behavior: Behavior normal.        Thought Content: Thought content normal.        Judgment: Judgment normal.      Assessment and Plan: Pelvic pain -     US   PELVIC COMPLETE WITH TRANSVAGINAL; Future  Vaginal discharge  Anal tear -     Hydrocortisone  (Perianal); Place 1 Application rectally 2 (two) times daily.  Dispense: 30 g; Refill: 0  Constipation, unspecified constipation type -     Hydrocortisone  (Perianal); Place 1 Application rectally 2 (two) times daily.  Dispense: 30 g; Refill: 0     Follow Up Instructions: Pt with recurrent episodes of vaginal discharge and pelvic pain since Nov 2024. Treated with Metrogel  in May for presumed BV. Episodes continue to return sometimes with blood. Not sexually active. Will proceed with TVUS for evaluation. Next time pt has episode, advised her to follow up for pelvic exam and swab.  Pt with constipation and likely anal tear/hemorrhoids. Treat with anusol  for now and advised to do sitz baths.     I discussed the assessment and treatment plan with the patient. The patient was provided an opportunity to ask questions and all were answered. The patient agreed with the plan and demonstrated an understanding of the instructions.   The patient was advised to call back or seek an in-person evaluation if the symptoms worsen or if the condition fails to improve as anticipated.  I provided 8 minutes of non-face-to-face time during this encounter.   Torrence CINDERELLA Barrier, MD

## 2024-10-04 ENCOUNTER — Other Ambulatory Visit: Payer: Self-pay | Admitting: Family Medicine

## 2024-10-04 DIAGNOSIS — I10 Essential (primary) hypertension: Secondary | ICD-10-CM

## 2024-10-05 ENCOUNTER — Encounter: Payer: Self-pay | Admitting: Family Medicine

## 2024-10-05 ENCOUNTER — Other Ambulatory Visit: Payer: Self-pay

## 2024-10-05 ENCOUNTER — Other Ambulatory Visit: Payer: Self-pay | Admitting: Family Medicine

## 2024-10-05 DIAGNOSIS — I10 Essential (primary) hypertension: Secondary | ICD-10-CM

## 2024-10-05 DIAGNOSIS — F418 Other specified anxiety disorders: Secondary | ICD-10-CM

## 2024-10-05 MED ORDER — METOPROLOL SUCCINATE ER 25 MG PO TB24
25.0000 mg | ORAL_TABLET | Freq: Every day | ORAL | 1 refills | Status: AC
Start: 2024-10-05 — End: ?

## 2024-10-05 MED ORDER — HYDROXYZINE HCL 25 MG PO TABS: 25.0000 mg | ORAL_TABLET | Freq: Three times a day (TID) | ORAL | 0 refills | Status: AC | PRN

## 2024-10-05 NOTE — Telephone Encounter (Signed)
 I have filled [facility]oprolol  with [facility] [address] for [facility]-days with one refill. Furthermore, I have also called [facility] on [address] and canceled previous prescription sent to them.

## 2024-10-05 NOTE — Telephone Encounter (Signed)
 Patient is requesting both medications, Metoprolol  ER Succinate 25mg  Tabs and Hydroxyzine  Pamoate 25mg  Capsules, be [facility] to [facility] [address] [address] [place], [place] [address]

## 2024-11-14 ENCOUNTER — Other Ambulatory Visit: Payer: Self-pay | Admitting: Family Medicine

## 2024-11-14 DIAGNOSIS — F418 Other specified anxiety disorders: Secondary | ICD-10-CM

## 2024-12-12 ENCOUNTER — Encounter: Payer: Self-pay | Admitting: Family Medicine

## 2024-12-13 ENCOUNTER — Other Ambulatory Visit: Payer: Self-pay | Admitting: Family Medicine

## 2024-12-13 DIAGNOSIS — S31831A Laceration without foreign body of anus, initial encounter: Secondary | ICD-10-CM | POA: Insufficient documentation

## 2024-12-13 DIAGNOSIS — K625 Hemorrhage of anus and rectum: Secondary | ICD-10-CM | POA: Insufficient documentation

## 2024-12-28 ENCOUNTER — Other Ambulatory Visit (HOSPITAL_COMMUNITY): Payer: Self-pay

## 2025-01-05 ENCOUNTER — Ambulatory Visit: Admitting: Family Medicine

## 2025-01-10 NOTE — Progress Notes (Unsigned)
 "  Chief Complaint: Rectal bleeding and anal tear  HPI:    Norma Harris is a 44 year old female with a past medical history as listed below including hypertension, who was referred to me by Colette Torrence GRADE, MD for a complaint of rectal bleeding and anal tear.      07/19/24 CBC and CMP normal.    12/12/2024 patient contacted PCP.  At that time discussed some rectal bleeding which was recurring.  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 Swallow whole.     Docusate Sodium (DSS) 100 MG CAPS Take 1 capsule by mouth 2 (two) times daily.     escitalopram  (LEXAPRO ) 20 MG tablet TAKE 1 TABLET BY MOUTH EVERY DAY 90 tablet 0   gabapentin  (NEURONTIN ) 100 MG capsule Take 1 capsule (100 mg total) by mouth 2 (two) times daily as needed. 60 capsule 0   hydrocortisone  (ANUSOL -HC) 2.5 % rectal cream Place 1 Application rectally 2 (two) times daily. 30 g 0   hydrOXYzine  (ATARAX ) 25 MG tablet Take 1 tablet (25 mg total) by mouth 3 (three) times daily as needed. 90 tablet 0   levocetirizine (XYZAL ) 5 MG tablet Take 1 tablet (5 mg total) by mouth every evening. 90 tablet 1   levonorgestrel (MIRENA) 20 MCG/DAY IUD 1 each by  Intrauterine route once.     metFORMIN  (GLUCOPHAGE -XR) 500 MG 24 hr tablet Take 1 tablet (500 mg total) by mouth daily with breakfast. 90 tablet 1   metoprolol  succinate (TOPROL -XL) 25 MG 24 hr tablet Take 1 tablet (25 mg total) by mouth daily. 90 tablet 1   metroNIDAZOLE  (METROGEL ) 0.75 % vaginal gel Place 1 Applicatorful vaginally 2 (two) times daily. 70 g 0   oxyCODONE (OXY IR/ROXICODONE) 5 MG immediate release tablet Take 5 mg by mouth every 6 (six) hours as needed.     oxyCODONE-acetaminophen (PERCOCET/ROXICET) 5-325 MG tablet Take by mouth every 4 (four) hours as needed for severe pain.     tiZANidine (ZANAFLEX) 4 MG capsule Take 4 mg by mouth at bedtime.     traZODone  (DESYREL ) 50 MG tablet TAKE 2 TABLETS(100 MG) BY MOUTH AT BEDTIME AS NEEDED FOR SLEEP 180 tablet 0   No current facility-administered medications for this visit.    Allergies as of 01/12/2025 - Review Complete 07/21/2024  Allergen Reaction Noted   Arnica Rash 07/25/2013   Arnica [place]  Other (See Comments) and Rash 07/25/2013   Duloxetine Other (See Comments) 06/30/2012   Duloxetine hcl Other (See Comments) 06/07/2015   Pseudoephedrine  03/28/2020   Pseudoephedrine-guaifenesin       Family History  Problem Relation Age of Onset   Fibromyalgia Mother  Hyperlipidemia Father    Alzheimer's disease Maternal Grandmother    Diabetes Maternal Grandfather    Cancer Maternal Grandfather    Lung cancer Paternal Grandfather     Social History   Socioeconomic History   Marital status: Single    Spouse name: single   Number of children: 0   Years of education: 16   Highest education level: Bachelor's degree (e.g., BA, AB, BS)  Occupational History   Occupation: middle school history teacher  Tobacco Use   Smoking status: Never   Smokeless tobacco: Never  Vaping Use   Vaping status: Never Used  Substance and Sexual Activity   Alcohol use: Yes    Comment: occasionally   Drug use: No   Sexual activity: Never     Birth control/protection: None, I.U.D.  Other Topics Concern   Not on file  Social History Narrative   Not on file   Social Drivers of Health   Tobacco Use: Low Risk (07/21/2024)   Patient History    Smoking Tobacco Use: Never    Smokeless Tobacco Use: Never    Passive Exposure: Not on file  Financial Resource Strain: Low Risk (07/18/2024)   Overall Financial Resource Strain (CARDIA)    Difficulty of Paying Living Expenses: Not hard at all  Food Insecurity: No Food Insecurity (07/18/2024)   Epic    Worried About Radiation Protection Practitioner of Food in the Last Year: Never true    Ran Out of Food in the Last Year: Never true  Transportation Needs: No Transportation Needs (07/18/2024)   Epic    Lack of Transportation (Medical): No    Lack of Transportation (Non-Medical): No  Physical Activity: Insufficiently Active (07/18/2024)   Exercise Vital Sign    Days of Exercise per Week: 2 days    Minutes of Exercise per Session: 20 min  Stress: Stress Concern Present (07/18/2024)   [employer] of Occupational Health - Occupational Stress Questionnaire    Feeling of Stress: Rather much  Social Connections: Unknown (07/18/2024)   Social Connection and Isolation Panel    Frequency of Communication with Friends and Family: More than three times a week    Frequency of Social Gatherings with Friends and Family: Once a week    Attends Religious Services: More than 4 times per year    Active Member of [employer] or Organizations: Not on file    Attends Banker Meetings: Not on file    Marital Status: Never married  Intimate Partner Violence: Not on file  Depression (PHQ2-9): Medium Risk (09/08/2023)   Depression (PHQ2-9)    PHQ-2 Score: 9  Alcohol Screen: Low Risk (07/18/2024)   Alcohol Screen    Last Alcohol Screening Score (AUDIT): 1  Housing: High Risk (07/18/2024)   Epic    Unable to Pay for Housing in the Last Year: Yes    Number of Times Moved in the Last Year: 1    Homeless in the Last Year: No   Utilities: Low Risk (10/05/2023)   Received from [facility]   Utilities    In the past 12 months has the electric, gas, oil, or water company threatened to shut off services in your home? : No  Health Literacy: Not on file    Review of Systems:    Constitutional: No weight loss, fever, chills, weakness or fatigue HEENT: Eyes: No change in vision               Ears, Nose, Throat:  No change in  hearing or congestion Skin: No rash or itching Cardiovascular: No chest pain, chest pressure or palpitations   Respiratory: No SOB or cough Gastrointestinal: See HPI and otherwise negative Genitourinary: No dysuria or change in urinary frequency Neurological: No headache, dizziness or syncope Musculoskeletal: No new muscle or joint pain Hematologic: No bleeding or bruising Psychiatric: No history of depression or anxiety    Physical Exam:  Vital signs: There were no vitals taken for this visit.  Constitutional:   Pleasant Caucasian female appears to be in NAD, Well developed, Well nourished, alert and cooperative Head:  Normocephalic and atraumatic. Eyes:   PEERL, EOMI. No icterus. Conjunctiva pink. Ears:  Normal auditory acuity. Neck:  Supple Throat: Oral cavity and pharynx without inflammation, swelling or lesion.  Respiratory: Respirations even and unlabored. Lungs clear to auscultation bilaterally.   No wheezes, crackles, or rhonchi.  Cardiovascular: Normal S1, S2. No MRG. Regular rate and rhythm. No peripheral edema, cyanosis or pallor.  Gastrointestinal:  Soft, nondistended, nontender. No rebound or guarding. Normal bowel sounds. No appreciable masses or hepatomegaly. Rectal:  Not performed.  Msk:  Symmetrical without gross deformities. Without edema, no deformity or joint abnormality.  Neurologic:  Alert and  oriented x4;  grossly normal neurologically.  Skin:   Dry and intact without significant lesions or rashes. Psychiatric: Oriented to person, place and time. Demonstrates  good judgement and reason without abnormal affect or behaviors.  RELEVANT LABS AND IMAGING: CBC    Component Value Date/Time   WBC 7.9 07/19/2024 1337   WBC 12.1 (H) 08/28/2015 0306   RBC 4.29 07/19/2024 1337   RBC 4.64 08/28/2015 0306   HGB 12.8 07/19/2024 1337   HCT 38.4 07/19/2024 1337   PLT 324 07/19/2024 1337   MCV 90 07/19/2024 1337   MCH 29.8 07/19/2024 1337   MCH 29.6 08/28/2015 0306   MCHC 33.3 07/19/2024 1337   MCHC 33.5 08/28/2015 0306   RDW 13.6 07/19/2024 1337   LYMPHSABS 2.5 07/19/2024 1337   EOSABS 0.1 07/19/2024 1337   BASOSABS 0.0 07/19/2024 1337    CMP     Component Value Date/Time   NA 137 07/19/2024 1337   K 4.2 07/19/2024 1337   CL 100 07/19/2024 1337   CO2 22 07/19/2024 1337   GLUCOSE 89 07/19/2024 1337   GLUCOSE 109 (H) 08/28/2015 0306   BUN 15 07/19/2024 1337   CREATININE 0.90 07/19/2024 1337   CALCIUM  9.7 07/19/2024 1337   PROT 7.0 07/19/2024 1337   ALBUMIN 4.2 07/19/2024 1337   AST 19 07/19/2024 1337   ALT 16 07/19/2024 1337   ALKPHOS 73 07/19/2024 1337   BILITOT 0.3 07/19/2024 1337   GFRNONAA 97 04/02/2018 1219   GFRAA 111 04/02/2018 1219    Assessment: 1. ***  Plan: 1. ***     Delon Failing, PA-C [facility] 01/10/2025, 3:35 PM  Cc: Colette Torrence GRADE, MD  "

## 2025-01-12 ENCOUNTER — Ambulatory Visit: Admitting: Physician Assistant

## 2025-01-12 ENCOUNTER — Encounter: Payer: Self-pay | Admitting: Physician Assistant

## 2025-01-12 VITALS — BP 128/74 | HR 82 | Ht 64.0 in | Wt 220.1 lb

## 2025-01-12 DIAGNOSIS — K6289 Other specified diseases of anus and rectum: Secondary | ICD-10-CM

## 2025-01-12 DIAGNOSIS — K5900 Constipation, unspecified: Secondary | ICD-10-CM | POA: Diagnosis not present

## 2025-01-12 DIAGNOSIS — K602 Anal fissure, unspecified: Secondary | ICD-10-CM | POA: Diagnosis not present

## 2025-01-12 NOTE — Patient Instructions (Addendum)
 _______________________________________________________  If your blood pressure at your visit was 140/90 or greater, please contact your primary care physician to follow up on this.  _______________________________________________________  If you are age 44 or older, your body mass index should be between 23-30. Your Body mass index is 37.78 kg/m. If this is out of the aforementioned range listed, please consider follow up with your Primary Care Provider.  If you are age 62 or younger, your body mass index should be between 19-25. Your Body mass index is 37.78 kg/m. If this is out of the aformentioned range listed, please consider follow up with your Primary Care Provider.   ________________________________________________________  The [facility] providers would like to encourage you to use MYCHART to communicate with providers for non-urgent requests or questions.  Due to long hold times on the telephone, sending your provider a message by [facility] may be a faster and more efficient way to get a response.  Please allow 48 business hours for a response.  Please remember that this is for non-urgent requests.  _______________________________________________________  [facility] is using a team-based approach to care.  Your team is made up of your doctor and two to three APPS. Our APPS (Nurse Practitioners and Physician Assistants) work with your physician to ensure care continuity for you. They are fully qualified to address your health concerns and develop a treatment plan. They communicate directly with your gastroenterologist to care for you. Seeing the Advanced Practice Practitioners on your physician's team can help you by facilitating care more promptly, often allowing for earlier appointments, access to diagnostic testing, procedures, and other specialty referrals.   Please purchase the following medications over the counter and take as directed: Recticare with lidocaine  as  needed Colace daily  [facility] information is below:  verbal called to pharmacy Address: [address] [place], [place] [address]  [phone]   for the Diltiazem 2% Apply a small amount to the inside and external rectum area 3 times a day for 6-8 weeks.  *Please DO NOT go directly from our office to pick up this medication! Give the pharmacy 1 day to process the prescription as this is compounded and takes time to make.  You have been scheduled for an appointment with Delon Failing PA-C on 03-03-25 at 210pm . Please arrive 10 minutes early for your appointment.   How to Take a Sitz Bath A sitz bath is a warm water bath that may be used to care for your rectum, genital area, or the area between your rectum and genitals (perineum). In a sitz bath, the water only comes up to your hips and covers your buttocks. A sitz bath may be done in a bathtub or with a portable sitz bath that fits over the toilet. Your health care provider may recommend a sitz bath to help: Relieve pain and discomfort after delivering a baby. Relieve pain and itching from hemorrhoids or anal fissures. Relieve pain after certain surgeries. Relax muscles that are sore or tight. How to take a sitz bath Take 2-4 sitz baths a day, or as many as told by your health care provider. Bathtub sitz bath To take a sitz bath in a bathtub: Partially fill a bathtub with warm water. The water should be deep enough to cover your hips and buttocks when you are sitting in the bathtub. Follow your health care provider's instructions if you are told to put medicine in the water. Sit in the water. Open the bathtub drain a little,  and leave it open during your bath. Turn on the warm water again, enough to replace the water that is draining out. Keep the water running throughout your bath. This helps keep the water at the right level and temperature. Soak in the water for 15-20 minutes, or as long as told by your health  care provider. When you are done, be careful when you stand up. You may feel dizzy. After the sitz bath, pat yourself dry. Do not rub your skin to dry it.  Over-the-toilet sitz bath To take a sitz bath with an over-the-toilet basin: Follow the manufacturer's instructions. Fill the basin with warm water. Follow your health care provider's instructions if you were told to put medicine in the water. Sit on the seat. Make sure the water covers your buttocks and perineum. Soak in the water for 15-20 minutes, or as long as told by your health care provider. After the sitz bath, pat yourself dry. Do not rub your skin to dry it. Clean and dry the basin between uses. Discard the basin if it cracks, or according to the manufacturer's instructions.  Contact a health care provider if: Your pain or itching gets worse. Stop doing sitz baths if your symptoms get worse. You have new symptoms. Stop doing sitz baths until you talk with your health care provider. Summary A sitz bath is a warm water bath in which the water only comes up to your hips and covers your buttocks. Your health care provider may recommend a sitz bath to help relieve pain and discomfort after delivering a baby, relieve pain and itching from hemorrhoids or anal fissures, relieve pain after certain surgeries, or help to relax muscles that are sore or tight. Take 2-4 sitz baths a day, or as many as told by your health care provider. Soak in the water for 15-20 minutes. Stop doing sitz baths if your symptoms get worse. This information is not intended to replace advice given to you by your health care provider. Make sure you discuss any questions you have with your health care provider. Document Revised: 03/04/2022 Document Reviewed: 03/04/2022 Elsevier Patient Education  2024 [employer]  Colace daily

## 2025-01-19 ENCOUNTER — Ambulatory Visit: Admitting: Family Medicine

## 2025-03-03 ENCOUNTER — Ambulatory Visit: Admitting: Physician Assistant
